# Patient Record
Sex: Male | Born: 1965
Health system: Southern US, Community
[De-identification: ages and names within clinical notes are randomized; demographics above are authoritative.]

## PROBLEM LIST (undated history)

## (undated) DIAGNOSIS — J189 Pneumonia, unspecified organism: Secondary | ICD-10-CM

## (undated) DIAGNOSIS — F32A Depression, unspecified: Secondary | ICD-10-CM

## (undated) DIAGNOSIS — F329 Major depressive disorder, single episode, unspecified: Secondary | ICD-10-CM

## (undated) DIAGNOSIS — C801 Malignant (primary) neoplasm, unspecified: Secondary | ICD-10-CM

## (undated) DIAGNOSIS — C61 Malignant neoplasm of prostate: Secondary | ICD-10-CM

## (undated) DIAGNOSIS — I10 Essential (primary) hypertension: Secondary | ICD-10-CM

## (undated) DIAGNOSIS — Z923 Personal history of irradiation: Secondary | ICD-10-CM

## (undated) DIAGNOSIS — M109 Gout, unspecified: Secondary | ICD-10-CM

## (undated) DIAGNOSIS — F419 Anxiety disorder, unspecified: Secondary | ICD-10-CM

## (undated) HISTORY — PX: OTHER SURGICAL HISTORY: SHX169

---

## 2004-10-03 ENCOUNTER — Inpatient Hospital Stay (HOSPITAL_COMMUNITY): Admission: RE | Admit: 2004-10-03 | Discharge: 2004-10-07 | Payer: Self-pay | Admitting: Psychiatry

## 2004-10-03 ENCOUNTER — Emergency Department (HOSPITAL_COMMUNITY): Admission: EM | Admit: 2004-10-03 | Discharge: 2004-10-03 | Payer: Self-pay | Admitting: Emergency Medicine

## 2004-10-03 ENCOUNTER — Ambulatory Visit: Payer: Self-pay | Admitting: Psychiatry

## 2004-10-10 ENCOUNTER — Other Ambulatory Visit (HOSPITAL_COMMUNITY): Admission: RE | Admit: 2004-10-10 | Discharge: 2004-11-04 | Payer: Self-pay | Admitting: Psychiatry

## 2005-03-28 ENCOUNTER — Other Ambulatory Visit (HOSPITAL_COMMUNITY): Admission: RE | Admit: 2005-03-28 | Discharge: 2005-04-13 | Payer: Self-pay | Admitting: Psychiatry

## 2005-03-29 ENCOUNTER — Ambulatory Visit: Payer: Self-pay | Admitting: Psychiatry

## 2008-03-04 ENCOUNTER — Emergency Department (HOSPITAL_COMMUNITY): Admission: EM | Admit: 2008-03-04 | Discharge: 2008-03-04 | Payer: Self-pay | Admitting: Emergency Medicine

## 2008-03-05 ENCOUNTER — Inpatient Hospital Stay (HOSPITAL_COMMUNITY): Admission: AD | Admit: 2008-03-05 | Discharge: 2008-03-08 | Payer: Self-pay | Admitting: Psychiatry

## 2008-03-05 ENCOUNTER — Ambulatory Visit: Payer: Self-pay | Admitting: Psychiatry

## 2008-03-05 ENCOUNTER — Emergency Department (HOSPITAL_COMMUNITY): Admission: EM | Admit: 2008-03-05 | Discharge: 2008-03-05 | Payer: Self-pay | Admitting: Emergency Medicine

## 2008-05-24 ENCOUNTER — Emergency Department (HOSPITAL_COMMUNITY): Admission: EM | Admit: 2008-05-24 | Discharge: 2008-05-24 | Payer: Self-pay | Admitting: Family Medicine

## 2008-10-23 ENCOUNTER — Emergency Department (HOSPITAL_COMMUNITY): Admission: EM | Admit: 2008-10-23 | Discharge: 2008-10-23 | Payer: Self-pay | Admitting: Emergency Medicine

## 2009-10-05 ENCOUNTER — Emergency Department (HOSPITAL_COMMUNITY): Admission: EM | Admit: 2009-10-05 | Discharge: 2009-10-05 | Payer: Self-pay | Admitting: Family Medicine

## 2009-10-11 ENCOUNTER — Emergency Department (HOSPITAL_COMMUNITY): Admission: EM | Admit: 2009-10-11 | Discharge: 2009-10-11 | Payer: Self-pay | Admitting: Family Medicine

## 2010-01-19 ENCOUNTER — Encounter
Admission: RE | Admit: 2010-01-19 | Discharge: 2010-02-10 | Payer: Self-pay | Source: Home / Self Care | Attending: Orthopedic Surgery | Admitting: Orthopedic Surgery

## 2010-02-13 ENCOUNTER — Encounter
Admission: RE | Admit: 2010-02-13 | Discharge: 2010-03-01 | Payer: Self-pay | Source: Home / Self Care | Attending: Orthopedic Surgery | Admitting: Orthopedic Surgery

## 2010-03-05 ENCOUNTER — Encounter: Payer: Self-pay | Admitting: Orthopedic Surgery

## 2010-04-29 LAB — POCT RAPID STREP A (OFFICE): Streptococcus, Group A Screen (Direct): NEGATIVE

## 2010-05-30 LAB — URINALYSIS, ROUTINE W REFLEX MICROSCOPIC
Glucose, UA: NEGATIVE mg/dL
Protein, ur: NEGATIVE mg/dL
Specific Gravity, Urine: 1.021 (ref 1.005–1.030)
Urobilinogen, UA: 1 mg/dL (ref 0.0–1.0)

## 2010-05-30 LAB — COMPREHENSIVE METABOLIC PANEL
AST: 84 U/L — ABNORMAL HIGH (ref 0–37)
Albumin: 4.7 g/dL (ref 3.5–5.2)
Alkaline Phosphatase: 78 U/L (ref 39–117)
Chloride: 101 mEq/L (ref 96–112)
GFR calc Af Amer: >60 mL/min (ref >60)
Potassium: 4.1 mEq/L (ref 3.5–5.1)
Total Bilirubin: 1.6 mg/dL — ABNORMAL HIGH (ref 0.3–1.2)

## 2010-05-30 LAB — CBC
HCT: 50.5 % (ref 39.0–52.0)
HCT: 51.1 % (ref 39.0–52.0)
Hemoglobin: 17.6 g/dL — ABNORMAL HIGH (ref 13.0–17.0)
Platelets: 225 10*3/uL (ref 150–400)
RBC: 5.08 MIL/uL (ref 4.22–5.81)
WBC: 7.1 10*3/uL (ref 4.0–10.5)

## 2010-05-30 LAB — DIFFERENTIAL
Eosinophils Relative: 2 % (ref 0–5)
Lymphocytes Relative: 31 % (ref 12–46)
Lymphs Abs: 2.5 10*3/uL (ref 0.7–4.0)
Monocytes Absolute: 0.5 10*3/uL (ref 0.1–1.0)
Monocytes Relative: 6 % (ref 3–12)
Neutro Abs: 4.8 10*3/uL (ref 1.7–7.7)

## 2010-05-30 LAB — POCT I-STAT, CHEM 8
BUN: <3 mg/dL — ABNORMAL LOW (ref 6–23)
Chloride: 102 mEq/L (ref 96–112)
Potassium: 4.7 mEq/L (ref 3.5–5.1)
Sodium: 138 mEq/L (ref 135–145)
TCO2: 26 mmol/L (ref 0–100)

## 2010-05-30 LAB — RAPID URINE DRUG SCREEN, HOSP PERFORMED
Amphetamines: NOT DETECTED
Benzodiazepines: NOT DETECTED
Benzodiazepines: POSITIVE — AB
Cocaine: NOT DETECTED
Tetrahydrocannabinol: NOT DETECTED

## 2010-05-30 LAB — TRICYCLICS SCREEN, URINE: TCA Scrn: NOT DETECTED

## 2010-05-30 LAB — BASIC METABOLIC PANEL
BUN: 1 mg/dL — ABNORMAL LOW (ref 6–23)
Chloride: 98 mEq/L (ref 96–112)
GFR calc Af Amer: >60 mL/min (ref >60)
GFR calc non Af Amer: >60 mL/min (ref >60)
Potassium: 4.1 mEq/L (ref 3.5–5.1)
Sodium: 136 mEq/L (ref 135–145)

## 2010-05-30 LAB — ETHANOL
Alcohol, Ethyl (B): 108 mg/dL — ABNORMAL HIGH (ref 0–10)
Alcohol, Ethyl (B): 299 mg/dL — ABNORMAL HIGH (ref 0–10)

## 2010-06-28 NOTE — H&P (Signed)
NAME:  Richard Holder, Richard Holder NO.:  [id number]   MEDICAL RECORD NO.:  [id number]          PATIENT TYPE:  IPS   LOCATION:  0500                          FACILITY:  [facility]   PHYSICIAN:  Landry Corporal, N.P.    DATE OF BIRTH:  13-Jun-1965   DATE OF ADMISSION:  03/05/2008  DATE OF DISCHARGE:                       PSYCHIATRIC ADMISSION ASSESSMENT   HISTORY OF PRESENT ILLNESS:  The patient is here for alcohol dependence,  wanting to get help, feeling scared about his use of alcohol, drinking  approximately 10-14 beers daily.  His drinking has increased over the  past several months.  Although feeling depressed, he denies any suicidal  thoughts.  Denies any other substance use.  Last drink was yesterday.   PAST PSYCHIATRIC HISTORY:  The patient is here in 2006 for problems with  alcohol.  Was at [facility] in the past.   SOCIAL HISTORY:  He is a married male, unemployed, has 2 years of  college.   FAMILY HISTORY:  Is unknown.   INDICATIONS:  The patient smokes cigarettes.  Again, drinking as above.  He denies any other substance, denies any seizure activity.   Primary care Lacey Wallman listed is Soledad Gerlach.   MEDICAL PROBLEMS:  Are hypertension, gout, and reflux.   MEDICATIONS:  Has been on atenolol, Prozac 20, allopurinol 100 mg daily.   DRUG ALLERGIES:  No known allergies.   PHYSICAL EXAM:  This is a middle-aged male resting in bed at this time,  fully assessed at [facility] emergency department.  Physical exam was  reviewed with no significant findings.  He did receive 2 mg of Ativan.  Temperature 97.6, 84 heart rate, 20 respirations, blood pressure is  123/84, 6 feet tall, 170 pounds.  Urine drug screen is positive for  benzodiazepines, BUN less than 3.  Alcohol level of 108.   MENTAL STATUS EXAM:  This is a sleepy but cooperative male, good eye  contact, appropriately dressed.  Speech is soft-spoken and mood is  depressed, feeling uncomfortable with some  withdrawal symptoms.  Thought  process are coherent and goal directed, function intact.  Memory is  intact.  Judgment appears to be good.  Insight also appears to be good.  Poor impulse control related to alcohol use.   AXIS I:  Alcohol dependence, depressive disorder NOS.  AXIS II:  Deferred.  AXIS III:  Hypertension, gout, and reflux.  AXIS IV:  Other psychosocial problems, possible problems with  occupation.  AXIS V:  Current is 40.   PLAN:  Contract for safety.  Stabilize mood and thinking.  Will detox  with the Librium protocol.  Work on relapse prevention.  Continue to  identify comorbidities.  Will continue with the patient's Prozac as  well.  The patient will be in the dual diagnosis group.  His case  manager will identify his followup.  Tentative length of stay is 3-4  days.      Landry Corporal, N.P.     JO/MEDQ  D:  03/06/2008  T:  03/06/2008  Job:  [id number]

## 2010-07-01 NOTE — Discharge Summary (Signed)
NAME:  DETROIT, FADELEY NO.:  [id number]   MEDICAL RECORD NO.:  [id number]          PATIENT TYPE:  IPS   LOCATION:  0500                          FACILITY:  [facility]   PHYSICIAN:  Geoffery Lyons, M.D.      DATE OF BIRTH:  September 11, 1965   DATE OF ADMISSION:  03/05/2008  DATE OF DISCHARGE:  03/08/2008                               DISCHARGE SUMMARY   CHIEF COMPLAINT AND PRESENT ILLNESS:  This was one of several admissions  to [facility], last one in 2006, for this 45 year old  male who endorsed he relapsed, was scared about his use of alcohol,  drinking 10 to 14 beers daily.  The drinking has increased over the past  several months, feeling depressed but denies any active suicidal  ideations.  No other substances.  The last drink was the day before.   PAST PSYCHIATRIC HISTORY:  Last admission in 2006 was at __________as  well as the [facility].   SUBSTANCE ABUSE HISTORY:  As already stated, dependence of alcohol.   MEDICAL HISTORY:  1. Active hypertension.  2. Gout.  3. Gastroesophageal reflux.   MEDICATIONS:  He has been on:  1. Atenolol.  2. Prozac 20 mg per day.  3. Allopurinol 100 mg per day.   PHYSICAL EXAMINATION:  Failed to show any acute findings.   LABORATORY WORKUP:  White blood cell 7.1, hemoglobin 17.4, mean  corpuscular component 100.6, sodium 140, potassium 4.1,  glucose 106,  SGOT 84, SGPT 154, total bilirubin 1.6, and TSH 5.883.   Mental status exam reveals alert and cooperative male in bed.  Endorsed  he was not feeling good, very soft spoken.  Mood depressed.  Affect  depressed.  Feeling uncomfortable with some of the withdrawal.  Thought  process is logical,  coherent, and relevant.  Endorsed a lot of shame  and guilt for the ongoing relapses.  No delusions.  No active suicidal  or homicidal ideas.  No Hallucinations.  Cognition well preserved.   ADMISSION DIAGNOSES:  AXIS I: Alcohol dependence.  Major  depressive  disorder.  AXIS II: No diagnosis.  AXIS III: Hypertension, gout, and gastroesophageal reflux.  AXIS IV: Moderate.  AXIS V: Global assessment of functioning upon admission 35, highest  global assessment of functioning in the last year 60.   COURSE IN HOSPITAL:  He was readmitted.  Started in individual and group  psychotherapy.  We  detoxified with Librium.  He was given some Ambien  for sleep and he was restarted on Prozac.  As already stated, 42-year-  old male, married x2, divorced x1.  Endorsed conflict in the  relationship with his wife, Dorna Leitz, when he came for treatment in 2006,  was nonsupportive of his going to meetings.  Says that eventually they  broke up.  Started drinking couple of beers, increased use to up to 24  per day, mostly everyday.  As his alcohol use increased, he became more  and more depressed.  He was at [facility] in 2006, __________  2005.  Major depressive disorders, trials with  Prozac, Paxil, Serzone,  Lexapro, Risperdal, Neurontin, Seroquel, trazodone, and Campral for  cravings.  He said he remarried.  Wife is supportive of his recovery,  but endorsed that she does not understand his depression.  He continues  to endorse the depressed mood.  Endorsed that he was depressed due to  realization of past failures.  There was a family session with his wife.  On January 23rd, he endorsed that he was feeling much better in terms of  the detox.  Endorsed he wanted to get sober for himself and to make  sobriety a priority.  Endorsed that his personality is different when  he drinks.  Continued to endorse he was ashamed of his relapse.  The  depression was worsening in terms of the debility and lack of interest  but endorsed that he felt that the Prozac helped before and he was  looking forward to continuing to use Prozac.  On January 24th,  he was  in full contact of reality.  There were no active suicidal or  homicidal  ideas.  No hallucinations or  delusions.  He was oriented and  motivated  to pursue outpatient treatment.  Endorsed that he had a strong family  support and that he having been through rehab before knew what he needed  to do and he was willing to do it.   DISCHARGE DIAGNOSES:  AXIS I: Major depressive disorder.  Alcohol  dependence.  AXIS II: No diagnosis.  AXIS III: Gout and high blood pressure.  AXIS IV: Moderate.  AXIS V: Global assessment of functioning upon discharge 60.   Discharged on:  1. Prozac 20 mg per day.  2. Allopurinol 100 mg per day.  3. Atenolol 50 mg per day.   Follow up [facility], Psychotherapy.  Medication to be prescribed by his  primary care Jacksyn Beeks.      Geoffery Lyons, M.D.  Electronically Signed     IL/MEDQ  D:  03/20/2008  T:  03/21/2008  Job:  [id number]

## 2012-06-04 DIAGNOSIS — C61 Malignant neoplasm of prostate: Secondary | ICD-10-CM

## 2012-06-04 HISTORY — DX: Malignant neoplasm of prostate: C61

## 2012-06-04 HISTORY — PX: PROSTATE BIOPSY: SHX241

## 2012-06-13 ENCOUNTER — Other Ambulatory Visit: Payer: Self-pay | Admitting: Urology

## 2012-07-16 ENCOUNTER — Encounter (HOSPITAL_COMMUNITY): Payer: Self-pay | Admitting: Pharmacy Technician

## 2012-07-26 NOTE — Patient Instructions (Addendum)
20 REZA CRYMES  07/26/2012   Your procedure is scheduled on: 08/07/12  Report to [facility] at 10:00 AM.  Call this number if you have problems the morning of surgery [phone]: [phone]   Remember:   Do not eat food or drink liquids After Midnight.      Do not wear jewelry, make-up or nail polish.  Do not wear lotions, powders, or perfumes. You may wear deodorant.  Do not shave 48 hours prior to surgery. Men may shave face and neck.  Do not bring valuables to the hospital.  Contacts, dentures or bridgework may not be worn into surgery.  Leave suitcase in the car. After surgery it may be brought to your room.  For patients admitted to the hospital, checkout time is 11:00 AM the day of discharge.   Please read over the following fact sheets that you were given: MRSA Information.  Birdie Sons, RN  pre op nurse call if needed [phone]    FAILURE TO FOLLOW THESE INSTRUCTIONS MAY RESULT IN CANCELLATION OF YOUR SURGERY   Patient Signature: ___________________________________________

## 2012-07-29 ENCOUNTER — Encounter (HOSPITAL_COMMUNITY)
Admission: RE | Admit: 2012-07-29 | Discharge: 2012-07-29 | Disposition: A | Discharge status: Home / Self Care | Payer: 59 | Source: Ambulatory Visit | Attending: Urology | Admitting: Urology

## 2012-07-29 ENCOUNTER — Ambulatory Visit (HOSPITAL_COMMUNITY)
Admission: RE | Admit: 2012-07-29 | Discharge: 2012-07-29 | Disposition: A | Discharge status: Home / Self Care | Payer: 59 | Source: Ambulatory Visit | Attending: Urology | Admitting: Urology

## 2012-07-29 ENCOUNTER — Encounter (HOSPITAL_COMMUNITY): Payer: Self-pay

## 2012-07-29 DIAGNOSIS — Z01818 Encounter for other preprocedural examination: Secondary | ICD-10-CM | POA: Insufficient documentation

## 2012-07-29 DIAGNOSIS — I10 Essential (primary) hypertension: Secondary | ICD-10-CM | POA: Insufficient documentation

## 2012-07-29 DIAGNOSIS — Z01810 Encounter for preprocedural cardiovascular examination: Secondary | ICD-10-CM | POA: Insufficient documentation

## 2012-07-29 DIAGNOSIS — Z01812 Encounter for preprocedural laboratory examination: Secondary | ICD-10-CM | POA: Insufficient documentation

## 2012-07-29 DIAGNOSIS — F17200 Nicotine dependence, unspecified, uncomplicated: Secondary | ICD-10-CM | POA: Insufficient documentation

## 2012-07-29 DIAGNOSIS — R918 Other nonspecific abnormal finding of lung field: Secondary | ICD-10-CM | POA: Insufficient documentation

## 2012-07-29 HISTORY — DX: Malignant (primary) neoplasm, unspecified: C80.1

## 2012-07-29 HISTORY — DX: Major depressive disorder, single episode, unspecified: F32.9

## 2012-07-29 HISTORY — DX: Anxiety disorder, unspecified: F41.9

## 2012-07-29 HISTORY — DX: Essential (primary) hypertension: I10

## 2012-07-29 HISTORY — DX: Depression, unspecified: F32.A

## 2012-07-29 HISTORY — DX: Pneumonia, unspecified organism: J18.9

## 2012-07-29 LAB — CBC
Hemoglobin: 16.8 g/dL (ref 13.0–17.0)
MCH: 31.1 pg (ref 26.0–34.0)
Platelets: 233 10*3/uL (ref 150–400)
RBC: 5.4 MIL/uL (ref 4.22–5.81)
WBC: 8.6 10*3/uL (ref 4.0–10.5)

## 2012-07-29 LAB — BASIC METABOLIC PANEL
BUN: 12 mg/dL (ref 6–23)
CO2: 31 mEq/L (ref 19–32)
Chloride: 100 mEq/L (ref 96–112)
Creatinine, Ser: 0.84 mg/dL (ref 0.50–1.35)

## 2012-07-29 NOTE — Progress Notes (Signed)
Abnormal Chest x-ray results routed to Dr. Margarita Grizzle via [facility]

## 2012-08-07 ENCOUNTER — Encounter (HOSPITAL_COMMUNITY): Payer: Self-pay | Admitting: *Deleted

## 2012-08-07 ENCOUNTER — Encounter (HOSPITAL_COMMUNITY): Payer: Self-pay | Admitting: Anesthesiology

## 2012-08-07 ENCOUNTER — Encounter (HOSPITAL_COMMUNITY)
Admission: RE | Disposition: A | Discharge status: Home / Self Care | Payer: Self-pay | Source: Ambulatory Visit | Attending: Urology

## 2012-08-07 ENCOUNTER — Ambulatory Visit (HOSPITAL_COMMUNITY)
Admission: RE | Admit: 2012-08-07 | Discharge: 2012-08-08 | Disposition: A | Discharge status: Home / Self Care | Payer: 59 | Source: Ambulatory Visit | Attending: Urology | Admitting: Urology

## 2012-08-07 ENCOUNTER — Ambulatory Visit (HOSPITAL_COMMUNITY): Payer: 59 | Admitting: Anesthesiology

## 2012-08-07 DIAGNOSIS — I10 Essential (primary) hypertension: Secondary | ICD-10-CM | POA: Insufficient documentation

## 2012-08-07 DIAGNOSIS — C61 Malignant neoplasm of prostate: Secondary | ICD-10-CM | POA: Insufficient documentation

## 2012-08-07 DIAGNOSIS — Z79899 Other long term (current) drug therapy: Secondary | ICD-10-CM | POA: Insufficient documentation

## 2012-08-07 DIAGNOSIS — N529 Male erectile dysfunction, unspecified: Secondary | ICD-10-CM | POA: Insufficient documentation

## 2012-08-07 HISTORY — PX: ROBOT ASSISTED LAPAROSCOPIC RADICAL PROSTATECTOMY: SHX5141

## 2012-08-07 LAB — CBC
HCT: 43.7 % (ref 39.0–52.0)
Hemoglobin: 15.2 g/dL (ref 13.0–17.0)
MCH: 31.5 pg (ref 26.0–34.0)
MCHC: 34.8 g/dL (ref 30.0–36.0)
MCV: 90.7 fL (ref 78.0–100.0)
Platelets: 231 K/uL (ref 150–400)
RBC: 4.82 MIL/uL (ref 4.22–5.81)
RDW: 12.6 % (ref 11.5–15.5)
WBC: 19.4 K/uL — ABNORMAL HIGH (ref 4.0–10.5)

## 2012-08-07 LAB — CREATININE, SERUM
Creatinine, Ser: 0.88 mg/dL (ref 0.50–1.35)
GFR calc Af Amer: >90 mL/min (ref >90)
GFR calc non Af Amer: >90 mL/min (ref >90)

## 2012-08-07 LAB — HEMOGLOBIN AND HEMATOCRIT, BLOOD: HCT: 44 % (ref 39.0–52.0)

## 2012-08-07 SURGERY — ROBOTIC ASSISTED LAPAROSCOPIC RADICAL PROSTATECTOMY LEVEL 1
Anesthesia: General | Site: Abdomen | Wound class: Clean Contaminated

## 2012-08-07 MED ORDER — FLUOXETINE HCL 20 MG PO CAPS
40.0000 mg | ORAL_CAPSULE | Freq: Every day | ORAL | Status: DC
  Administered 2012-08-07: 40 mg via ORAL
  Filled 2012-08-07 (×2): qty 2

## 2012-08-07 MED ORDER — LIDOCAINE HCL (CARDIAC) 20 MG/ML IV SOLN
INTRAVENOUS | Status: DC | PRN
  Administered 2012-08-07: 80 mg via INTRAVENOUS

## 2012-08-07 MED ORDER — SODIUM CHLORIDE 0.9 % IR SOLN
Status: DC | PRN
  Administered 2012-08-07: 1000 mL

## 2012-08-07 MED ORDER — HEPARIN SODIUM (PORCINE) 5000 UNIT/ML IJ SOLN
5000.0000 [IU] | Freq: Once | INTRAMUSCULAR | Status: AC
  Administered 2012-08-07: 5000 [IU] via SUBCUTANEOUS
  Filled 2012-08-07: qty 1

## 2012-08-07 MED ORDER — PROPOFOL 10 MG/ML IV BOLUS
INTRAVENOUS | Status: DC | PRN
  Administered 2012-08-07: 200 mg via INTRAVENOUS

## 2012-08-07 MED ORDER — FENTANYL CITRATE 0.05 MG/ML IJ SOLN
INTRAMUSCULAR | Status: DC | PRN
  Administered 2012-08-07: 50 ug via INTRAVENOUS
  Administered 2012-08-07: 100 ug via INTRAVENOUS
  Administered 2012-08-07 (×4): 50 ug via INTRAVENOUS

## 2012-08-07 MED ORDER — SODIUM CHLORIDE 0.9 % IJ SOLN
INTRAMUSCULAR | Status: DC | PRN
  Administered 2012-08-07: 17:00:00

## 2012-08-07 MED ORDER — HEPARIN SODIUM (PORCINE) 5000 UNIT/ML IJ SOLN
5000.0000 [IU] | Freq: Three times a day (TID) | INTRAMUSCULAR | Status: DC
  Administered 2012-08-08: 5000 [IU] via SUBCUTANEOUS
  Filled 2012-08-07 (×4): qty 1

## 2012-08-07 MED ORDER — HYDROMORPHONE HCL PF 1 MG/ML IJ SOLN
0.2500 mg | INTRAMUSCULAR | Status: DC | PRN
  Administered 2012-08-07 (×2): 0.5 mg via INTRAVENOUS

## 2012-08-07 MED ORDER — BUPIVACAINE LIPOSOME 1.3 % IJ SUSP
20.0000 mL | Freq: Once | INTRAMUSCULAR | Status: DC
  Filled 2012-08-07: qty 20

## 2012-08-07 MED ORDER — ACETAMINOPHEN 325 MG PO TABS: 650.0000 mg | ORAL_TABLET | ORAL | Status: DC | PRN

## 2012-08-07 MED ORDER — STERILE WATER FOR IRRIGATION IR SOLN
Status: DC | PRN
  Administered 2012-08-07: 3000 mL

## 2012-08-07 MED ORDER — HYDROMORPHONE HCL PF 1 MG/ML IJ SOLN
INTRAMUSCULAR | Status: AC
  Filled 2012-08-07: qty 1

## 2012-08-07 MED ORDER — HYDROMORPHONE HCL PF 1 MG/ML IJ SOLN
INTRAMUSCULAR | Status: DC | PRN
  Administered 2012-08-07: 0.5 mg via INTRAVENOUS
  Administered 2012-08-07: 1 mg via INTRAVENOUS

## 2012-08-07 MED ORDER — INDIGOTINDISULFONATE SODIUM 8 MG/ML IJ SOLN
INTRAMUSCULAR | Status: AC
  Filled 2012-08-07: qty 10

## 2012-08-07 MED ORDER — LACTATED RINGERS IV SOLN
INTRAVENOUS | Status: DC | PRN
  Administered 2012-08-07 (×2): via INTRAVENOUS

## 2012-08-07 MED ORDER — CLONAZEPAM 0.5 MG PO TABS
0.5000 mg | ORAL_TABLET | Freq: Every day | ORAL | Status: DC
  Administered 2012-08-07: 0.5 mg via ORAL
  Filled 2012-08-07: qty 1

## 2012-08-07 MED ORDER — SODIUM CHLORIDE 0.9 % IV SOLN
INTRAVENOUS | Status: DC
  Administered 2012-08-07 – 2012-08-08 (×2): via INTRAVENOUS

## 2012-08-07 MED ORDER — LACTATED RINGERS IV SOLN: INTRAVENOUS | Status: DC

## 2012-08-07 MED ORDER — GLYCOPYRROLATE 0.2 MG/ML IJ SOLN
INTRAMUSCULAR | Status: DC | PRN
  Administered 2012-08-07: .6 mg via INTRAVENOUS

## 2012-08-07 MED ORDER — MORPHINE SULFATE 2 MG/ML IJ SOLN
2.0000 mg | INTRAMUSCULAR | Status: DC | PRN
  Administered 2012-08-07 – 2012-08-08 (×2): 2 mg via INTRAVENOUS
  Filled 2012-08-07 (×2): qty 1

## 2012-08-07 MED ORDER — BISACODYL 10 MG RE SUPP
10.0000 mg | Freq: Two times a day (BID) | RECTAL | Status: DC
  Administered 2012-08-07 – 2012-08-08 (×2): 10 mg via RECTAL
  Filled 2012-08-07 (×3): qty 1

## 2012-08-07 MED ORDER — SODIUM CHLORIDE 0.9 % IV BOLUS (SEPSIS)
1000.0000 mL | Freq: Once | INTRAVENOUS | Status: AC
  Administered 2012-08-07: 1000 mL via INTRAVENOUS

## 2012-08-07 MED ORDER — SENNOSIDES-DOCUSATE SODIUM 8.6-50 MG PO TABS
1.0000 | ORAL_TABLET | Freq: Two times a day (BID) | ORAL | Status: DC
  Administered 2012-08-07 – 2012-08-08 (×2): 1 via ORAL
  Filled 2012-08-07 (×3): qty 1

## 2012-08-07 MED ORDER — ACETAMINOPHEN 10 MG/ML IV SOLN
1000.0000 mg | Freq: Four times a day (QID) | INTRAVENOUS | Status: DC
  Administered 2012-08-07 – 2012-08-08 (×3): 1000 mg via INTRAVENOUS
  Filled 2012-08-07 (×4): qty 100

## 2012-08-07 MED ORDER — OXYCODONE HCL 5 MG PO TABS
5.0000 mg | ORAL_TABLET | ORAL | Status: DC | PRN
  Administered 2012-08-08: 5 mg via ORAL
  Filled 2012-08-07: qty 1

## 2012-08-07 MED ORDER — ATENOLOL 50 MG PO TABS
50.0000 mg | ORAL_TABLET | Freq: Every day | ORAL | Status: DC
  Administered 2012-08-07: 50 mg via ORAL
  Filled 2012-08-07 (×2): qty 1

## 2012-08-07 MED ORDER — ONDANSETRON HCL 4 MG/2ML IJ SOLN
4.0000 mg | INTRAMUSCULAR | Status: DC | PRN
  Administered 2012-08-07: 4 mg via INTRAVENOUS
  Filled 2012-08-07: qty 2

## 2012-08-07 MED ORDER — CEFAZOLIN SODIUM-DEXTROSE 2-3 GM-% IV SOLR
INTRAVENOUS | Status: AC
  Filled 2012-08-07: qty 50

## 2012-08-07 MED ORDER — BACITRACIN-NEOMYCIN-POLYMYXIN 400-5-5000 EX OINT: 1.0000 "application " | TOPICAL_OINTMENT | Freq: Three times a day (TID) | CUTANEOUS | Status: DC | PRN

## 2012-08-07 MED ORDER — LACTATED RINGERS IV SOLN
INTRAVENOUS | Status: DC | PRN
  Administered 2012-08-07: 14:00:00

## 2012-08-07 MED ORDER — HEPARIN SODIUM (PORCINE) 1000 UNIT/ML IJ SOLN
INTRAMUSCULAR | Status: AC
  Filled 2012-08-07: qty 1

## 2012-08-07 MED ORDER — INDIGOTINDISULFONATE SODIUM 8 MG/ML IJ SOLN
INTRAMUSCULAR | Status: DC | PRN
  Administered 2012-08-07 (×2): 5 mL via INTRAVENOUS

## 2012-08-07 MED ORDER — ONDANSETRON HCL 4 MG/2ML IJ SOLN
INTRAMUSCULAR | Status: DC | PRN
  Administered 2012-08-07: 4 mg via INTRAVENOUS

## 2012-08-07 MED ORDER — BUPIVACAINE-EPINEPHRINE PF 0.25-1:200000 % IJ SOLN
INTRAMUSCULAR | Status: AC
  Filled 2012-08-07: qty 30

## 2012-08-07 MED ORDER — HYOSCYAMINE SULFATE 0.125 MG SL SUBL
0.1250 mg | SUBLINGUAL_TABLET | SUBLINGUAL | Status: DC | PRN
  Filled 2012-08-07: qty 1

## 2012-08-07 MED ORDER — CEFAZOLIN SODIUM 1-5 GM-% IV SOLN
1.0000 g | Freq: Three times a day (TID) | INTRAVENOUS | Status: AC
  Administered 2012-08-07 – 2012-08-08 (×2): 1 g via INTRAVENOUS
  Filled 2012-08-07 (×2): qty 50

## 2012-08-07 MED ORDER — LACTATED RINGERS IV SOLN
INTRAVENOUS | Status: DC
  Administered 2012-08-07: 1000 mL via INTRAVENOUS

## 2012-08-07 MED ORDER — NEOSTIGMINE METHYLSULFATE 1 MG/ML IJ SOLN
INTRAMUSCULAR | Status: DC | PRN
  Administered 2012-08-07: 5 mg via INTRAVENOUS

## 2012-08-07 MED ORDER — MIDAZOLAM HCL 5 MG/5ML IJ SOLN
INTRAMUSCULAR | Status: DC | PRN
  Administered 2012-08-07: 2 mg via INTRAVENOUS

## 2012-08-07 MED ORDER — CEFAZOLIN SODIUM-DEXTROSE 2-3 GM-% IV SOLR
2.0000 g | INTRAVENOUS | Status: AC
  Administered 2012-08-07: 2 g via INTRAVENOUS

## 2012-08-07 MED ORDER — ROCURONIUM BROMIDE 100 MG/10ML IV SOLN
INTRAVENOUS | Status: DC | PRN
  Administered 2012-08-07: 50 mg via INTRAVENOUS
  Administered 2012-08-07 (×3): 10 mg via INTRAVENOUS
  Administered 2012-08-07 (×2): 20 mg via INTRAVENOUS

## 2012-08-07 SURGICAL SUPPLY — 57 items
APPLIER CLIP 5 13 M/L LIGAMAX5 (MISCELLANEOUS) ×2
CANISTER SUCTION 2500CC (MISCELLANEOUS) ×2 IMPLANT
CATH FOLEY 2WAY SLVR 18FR 30CC (CATHETERS) ×2 IMPLANT
CATH ROBINSON RED A/P 16FR (CATHETERS) ×2 IMPLANT
CATH ROBINSON RED A/P 8FR (CATHETERS) ×2 IMPLANT
CATH TIEMANN FOLEY 18FR 5CC (CATHETERS) ×2 IMPLANT
CHLORAPREP W/TINT 26ML (MISCELLANEOUS) ×2 IMPLANT
CLIP APPLIE 5 13 M/L LIGAMAX5 (MISCELLANEOUS) ×1 IMPLANT
CLIP LIGATING HEM O LOK PURPLE (MISCELLANEOUS) ×8 IMPLANT
CLOTH BEACON ORANGE TIMEOUT ST (SAFETY) ×2 IMPLANT
CORD HIGH FREQUENCY UNIPOLAR (ELECTROSURGICAL) ×2 IMPLANT
CORDS BIPOLAR (ELECTRODE) ×2 IMPLANT
COVER SURGICAL LIGHT HANDLE (MISCELLANEOUS) ×2 IMPLANT
COVER TIP SHEARS 8 DVNC (MISCELLANEOUS) ×1 IMPLANT
COVER TIP SHEARS 8MM DA VINCI (MISCELLANEOUS) ×1
CUTTER ECHEON FLEX ENDO 45 340 (ENDOMECHANICALS) ×2 IMPLANT
DECANTER SPIKE VIAL GLASS SM (MISCELLANEOUS) IMPLANT
DRAPE SURG IRRIG POUCH 19X23 (DRAPES) ×2 IMPLANT
DRAPE UTILITY XL STRL (DRAPES) ×2 IMPLANT
DRSG TEGADERM 2-3/8X2-3/4 SM (GAUZE/BANDAGES/DRESSINGS) ×8 IMPLANT
DRSG TEGADERM 4X4.75 (GAUZE/BANDAGES/DRESSINGS) ×4 IMPLANT
DRSG TEGADERM 6X8 (GAUZE/BANDAGES/DRESSINGS) ×4 IMPLANT
ELECT REM PT RETURN 9FT ADLT (ELECTROSURGICAL) ×2
ELECTRODE REM PT RTRN 9FT ADLT (ELECTROSURGICAL) ×1 IMPLANT
GAUZE SPONGE 2X2 8PLY STRL LF (GAUZE/BANDAGES/DRESSINGS) ×1 IMPLANT
GLOVE BIOGEL M 7.0 STRL (GLOVE) IMPLANT
GLOVE BIOGEL PI IND STRL 7.5 (GLOVE) ×3 IMPLANT
GLOVE BIOGEL PI INDICATOR 7.5 (GLOVE) ×3
GLOVE ECLIPSE 7.0 STRL STRAW (GLOVE) ×4 IMPLANT
GOWN PREVENTION PLUS XLARGE (GOWN DISPOSABLE) ×2 IMPLANT
GOWN STRL NON-REIN LRG LVL3 (GOWN DISPOSABLE) ×6 IMPLANT
GOWN STRL REIN XL XLG (GOWN DISPOSABLE) ×2 IMPLANT
HOLDER FOLEY CATH W/STRAP (MISCELLANEOUS) ×2 IMPLANT
IV LACTATED RINGERS 1000ML (IV SOLUTION) IMPLANT
KIT ACCESSORY DA VINCI DISP (KITS) ×1
KIT ACCESSORY DVNC DISP (KITS) ×1 IMPLANT
MARKER SKIN DUAL TIP RULER LAB (MISCELLANEOUS) ×2 IMPLANT
NDL SAFETY ECLIPSE 18X1.5 (NEEDLE) ×1 IMPLANT
NEEDLE HYPO 18GX1.5 SHARP (NEEDLE) ×1
PACK ROBOT UROLOGY CUSTOM (CUSTOM PROCEDURE TRAY) ×2 IMPLANT
POSITIONER SURGICAL ARM (MISCELLANEOUS) IMPLANT
POUCH SPECIMEN RETRIEVAL 10MM (ENDOMECHANICALS) ×2 IMPLANT
RELOAD GREEN ECHELON 45 (STAPLE) ×2 IMPLANT
SCISSORS LAP 5X35 DISP (ENDOMECHANICALS) ×2 IMPLANT
SCRUB PCMX 4 OZ (MISCELLANEOUS) ×2 IMPLANT
SET TUBE IRRIG SUCTION NO TIP (IRRIGATION / IRRIGATOR) ×2 IMPLANT
SOLUTION ELECTROLUBE (MISCELLANEOUS) ×2 IMPLANT
SPONGE GAUZE 2X2 STER 10/PKG (GAUZE/BANDAGES/DRESSINGS) ×1
STAPLER VISISTAT 35W (STAPLE) ×2 IMPLANT
SUT V-LOC BARB 180 2/0GR6 GS22 (SUTURE) ×2
SUT VIC AB 3-0 SH 27 (SUTURE) ×1
SUT VIC AB 3-0 SH 27X BRD (SUTURE) ×1 IMPLANT
SUT VICRYL 0 UR6 27IN ABS (SUTURE) ×8 IMPLANT
SUTURE V-LC BRB 180 2/0GR6GS22 (SUTURE) ×1 IMPLANT
SYR 27GX1/2 1ML LL SAFETY (SYRINGE) ×2 IMPLANT
TOWEL OR NON WOVEN STRL DISP B (DISPOSABLE) ×2 IMPLANT
WATER STERILE IRR 1500ML POUR (IV SOLUTION) IMPLANT

## 2012-08-07 NOTE — Anesthesia Preprocedure Evaluation (Addendum)
Anesthesia Evaluation  Patient identified by MRN, date of birth, ID band Patient awake    Reviewed: Allergy & Precautions, H&P , NPO status , Patient's Chart, lab work & pertinent test results, reviewed documented beta blocker date and time   Airway Mallampati: II TM Distance: >3 FB Neck ROM: full    Dental no notable dental hx. (+) Teeth Intact and Dental Advisory Given   Pulmonary Current Smoker,  breath sounds clear to auscultation  Pulmonary exam normal       Cardiovascular Exercise Tolerance: Good hypertension, Pt. on home beta blockers Rhythm:regular Rate:Normal     Neuro/Psych negative neurological ROS  negative psych ROS   GI/Hepatic negative GI ROS, Neg liver ROS,   Endo/Other  negative endocrine ROS  Renal/GU negative Renal ROS  negative genitourinary   Musculoskeletal   Abdominal   Peds  Hematology negative hematology ROS (+)   Anesthesia Other Findings   Reproductive/Obstetrics negative OB ROS                          Anesthesia Physical Anesthesia Plan  ASA: II  Anesthesia Plan: General   Post-op Pain Management:    Induction: Intravenous  Airway Management Planned: Oral ETT  Additional Equipment:   Intra-op Plan:   Post-operative Plan: Extubation in OR  Informed Consent: I have reviewed the patients History and Physical, chart, labs and discussed the procedure including the risks, benefits and alternatives for the proposed anesthesia with the patient or authorized representative who has indicated his/her understanding and acceptance.   Dental Advisory Given  Plan Discussed with: CRNA and Surgeon  Anesthesia Plan Comments:         Anesthesia Quick Evaluation

## 2012-08-07 NOTE — Preoperative (Signed)
Beta Blockers   Reason not to administer Beta Blockers:Not Applicable 

## 2012-08-07 NOTE — Transfer of Care (Signed)
Immediate Anesthesia Transfer of Care Note  Patient: Richard Holder  Procedure(s) Performed: Procedure(s): ROBOTIC ASSISTED LAPAROSCOPIC RADICAL PROSTATECTOMY LEVEL 1 (N/A)  Patient Location: PACU  Anesthesia Type:General  Level of Consciousness: awake, alert , oriented, sedated and patient cooperative  Airway & Oxygen Therapy: Patient Spontanous Breathing and Patient connected to face mask oxygen  Post-op Assessment: Report given to PACU RN and Post -op Vital signs reviewed and stable  Post vital signs: Reviewed and stable  Complications: No apparent anesthesia complications

## 2012-08-07 NOTE — H&P (Signed)
Urology History and Physical Exam  CC: Prostate cancer  HPI:  47 year old male presents today for prostate cancer. This was discovered based on a prostate biopsy for an elevated PSA of 5.8 in February 2014. Prostate biopsy revealed a Gleason score 3+3 equal 6 involving 3 cores. The cores involved were all on the left involving the left lateral and medial base as well as the left medial midportion of the prostate. His prostate volume was 26 cc. He is noted to have erectile dysfunction prior to his prostate biopsy. He presents today for robot-assisted radical prostatectomy with bilateral nerve sparing to be determined at the time of surgery. We have discussed the risks, benefits, alternatives, and likely that she tinkles. This has not been associated with gross hematuria. Resting makes it better or worse. UA 07/23/12 was negative for signs of infection.    Preoperative chest x-ray recommended to repeat a PA film with nipple markers to rule out pulmonary nodule. Given his extremely low chance of metastatic disease to the lung I have recommended proceeding with surgery as scheduled and obtaining a chest x-ray in the future.  PMH: Past Medical History  Diagnosis Date  . Hypertension   . Anxiety   . Depression   . Pneumonia     hx of  . Cancer     prostate     PSH: Past Surgical History  Procedure Laterality Date  . Skin cancer removed Left     shoulder    Allergies: No Known Allergies  Medications: No prescriptions prior to admission     Social History: History   Social History  . Marital Status: Married    Spouse Name: N/A    Number of Children: N/A  . Years of Education: N/A   Occupational History  . Not on file.   Social History Main Topics  . Smoking status: Current Every Day Smoker -- 1.00 packs/day for 30 years    Types: Cigarettes  . Smokeless tobacco: Never Used  . Alcohol Use: No     Comment: 4.5 years sober  . Drug Use: No  . Sexually Active: Not on file    Other Topics Concern  . Not on file   Social History Narrative  . No narrative on file    Family History: No family history on file.  Review of Systems: Positive: None. Negative: Fever, chest pain, or SOB.  A further 10 point review of systems was negative except what is listed in the HPI.  Physical Exam: Filed Vitals:   08/07/12 1009  BP: 117/83  Pulse: 80  Temp: 99 F (37.2 C)  Resp: 18    General: No acute distress.  Awake. Head:  Normocephalic.  Atraumatic. ENT:  EOMI.  Mucous membranes moist Neck:  Supple.  No lymphadenopathy. CV:  S1 present. S2 present. Regular rate. Pulmonary: Equal effort bilaterally.  Clear to auscultation bilaterally. Abdomen: Soft.  Non- tender to palpation. Skin:  Normal turgor.  No visible rash. Extremity: No gross deformity of bilateral upper extremities.  No gross deformity of    bilateral lower extremities. Neurologic: Alert. Appropriate mood.    Studies:  No results found for this basename: HGB, WBC, PLT,  in the last 72 hours  No results found for this basename: NA, K, CL, CO2, BUN, CREATININE, CALCIUM, MAGNESIUM, GFRNONAA, GFRAA,  in the last 72 hours   No results found for this basename: PT, INR, APTT,  in the last 72 hours   No components found with this basename:  ABG,     Assessment:  Prostate cancer  Plan: To OR  for robot-assisted radical prostatectomy with bilateral nerve sparing to be determined at the time of surgery.

## 2012-08-07 NOTE — Progress Notes (Signed)
Post op check.  Patient doing well in PACU. Pain controlled w/ IV pain medications.  Filed Vitals:   08/07/12 1800  BP: 156/95  Pulse: 88  Temp: 98.3 F (36.8 C)  Resp: 15   Gen: NAD. Somewhat sedated. CV: Regular pulses Abd: appropriately tender to palpation. Incisions dressed. JP w/ serosanguinous fluid. GU: Foley catheter draining blue urine (from indigo given during surgery)  Hgb 14.9  A/P: Prostate cancer Robotic prostatectomy completed today without complications.  -Ambulate tonight. -Observe overnight. -IV fluids.

## 2012-08-07 NOTE — Op Note (Signed)
DATE OF PROCEDURE: 08/07/12   OPERATIVE REPORT  SURGEON: Natalia Leatherwood, MD  ASSISTANT:  Pecola Leisure, PA   PREOPERATIVE DIAGNOSIS: Prostate cancer.  POSTOPERATIVE DIAGNOSIS: Prostate cancer.   PROCEDURE PERFORMED:  Robotic-assisted laparoscopic radical prostatectomy Bilateral nerve sparing.   ESTIMATED BLOOD LOSS: 100 cc.  DRAINS: Jackson-Pratt drain, left lower quadrant and Foley catheter.   SPECIMEN: Prostate, left seminal vesicle, portion of right seminal vesicle   FINDINGS: Right seminal vesicle difficult to dissect. Good plane between nerves and prostate.   HISTORY OF PRESENT ILLNESS: 47 year old male was found to have an elevated PSA. Biopsy revealed 3 cores of 3+3=6 prostate cancer. After evaluation and discussion of management options, he elected for surgical extirpation of his prostate.   PROCEDURE IN DETAIL: Informed consent was obtained. He received subcutaneous heparin for DVT prophylaxis before being taken to the OR. The patient was taken to the operating room, where he was placed in the supine position. IV antibiotics were infused and general anesthesia was induced. A time- out was performed, in which the correct patient, surgical site, and procedure were identified and agreed upon by the team. Hair was removed  from his abdomen and genitals and then he was placed in a dorsal  lithotomy position. All pertinent neurovascular pressure points were padded appropriately. His arms were tucked to the side using gel padding. After this, his abdomen and genitals were prepped and draped in the usual sterile fashion. A Foley catheter was placed on the field, 10 cc of sterile water was placed into the balloon.   He was then placed in a Trendelenburg position. Access was gained for laparoscopic procedure by using the Hasson technique by cutting down to the fascia in the midline above the umbilicus. Once the peritoneum was accessed, a 10 mm  port was placed and abdomen was  insufflated with carbon dioxide. Opening pressures were initially low so insufflation was continued. Next, markings were made for placement of the 1st & 3rd robotic arm ports in the usual areas with the ports being placed approximately 10 cm from the midline on either  side of camera (2nd) arm.  A 10 mm assistant port was placed on the far right lateral side of the abdomen. A 5 mm assistant port was placed between the right robotic (1st arm) and the camera port (2nd arm). The 4th robotic arm port was placed at the far left lateral side of the abdomen. All these were placed under direct visualization, and the robot was docked to the robotic ports.  After this was done, the urachal remnant was identified and divided and the bladder was  Dissected from the anterior wall of the abdomen. This was taken down to  the endopelvic fascia bilaterally and the perineum was split down to the  vas deferens bilaterally. The vas deferens was divided bipolar and monopolar electrocautery. After this was done, the prograsp was used to  retract the bladder cephalad. After this was done, the endopelvic fascia was incised  bilaterally and carried anterior and posteriorly bilaterally. The  puboprostatic ligaments were then divided and then the stapler device  was used to divide and ligate the dorsal venous complex.   After this was complete, attention was turned back to the junction between the prostate and bladder neck.  Dissection was carried down between the prostate and the bladder until  the Foley catheter was encountered. It was deflated and then placed  through the dissection site and then anterior traction was placed by  grasping  the catheter with the 4th robot arm. The remainder of the bladder neck  was then dissected out, and dissection was carried down posteriorly making sure to avoid reentry into the bladder until the seminal vesicles were encountered.   The seminal vesicles and vas deferens were then  dissected out bilaterally. The tissue around the seminal vesicles and vas deferens was adherent making dissection difficult. I was able to dissect out the left seminal vesicle in its entirety. The right seminal vesicle was not able to dissect out in its entirety. After these were done, they were placed on traction anteriorly and blunt dissection was carried out between the rectum and the prostate. During this process both seminal vesicles for and these were sent out to go along with the prostate for permanent pathology.  Attention was turned to the right side. It was evaluated and found to have a good plane of dissection, therefore a  nerve sparing technique was undertaken by incising medial to the neurovascular bundle and releasing it laterally. After this was carried  out, the right prostatic pedicle was controlled with sharp dissection  and Hem-o-lok clips. Dissection was then carried around posterior to  the prostate to the previous dissected area between the rectum and prostate.  Attention was turned to the left side. It was evaluated and found to have a good plane of dissection, therefore a  nerve sparing technique was undertaken by incising medial to the neurovascular bundle and releasing it laterally. After this was carried  out, the left prostatic pedicle was controlled with sharp dissection  and Hem-o-lok clips. Dissection was then carried around posterior to  the prostate to the previous dissected area between the rectum and prostate.  Dissection was then carried up to the urethra which was all that remained attaching the prostate to the body. The urethra was  then divided with scissors and the prostate was placed to the side.   The pelvis was irrigated and flushing of air into the rectal catheter showed no air bubbles.   I was able to visualize efflux from both ureter orifices. After this, anastomosis of the bladder neck to the urethra was carried out. A 3-0 V-lock suture was used to  approximate the posterior bladder to the posterior urethra.   A Double-armed Monocryl suture was used in a running fashion to perform the anastomosis. I was careful to ensure mucosal to mucosal approximation.  After this was done, a Foley catheter was placed through the anastomosis and into the bladder. It was irrigated and found to be water tight. It was tied down and the suture needles were removed from the body.   A Jackson-Pratt drain was placed through the port of the 4th robot arm. The specimen was placed in the EndoCatch bag. The robot was  undocked and the operating table was leveled. The EndoCatch bag was removed from the body by enlarging  the midline of the umbilicus. All ports were checked and found to be  free of bleeding. The 10 mm assistant port was closed with a suture  passer under direct visualization. The fascia of the umbilical port was close with interrupted vicryl suture in a figure-of-eight fashion until there was no defect palpable. Care was taken to avoid incorporation of intra-abdominal contents into the closure. After this was done, the drain was sutured into place, and the local injection with 40 mL of Exparel was performed. All wounds were irrigated with  sterile normal saline and then closed with Staples and then dressed with  sterile dressing.  Tegaderm and drain gauze was placed over the Jackson-Pratt drain and  Foley catheter was placed to closed drainage. The patient was placed  back in supine position. Anesthesia was reversed. He was taken to PACU  in stable condition. He will be kept overnight for evaluation.    All counts were correct at the end of the case.

## 2012-08-08 ENCOUNTER — Encounter (HOSPITAL_COMMUNITY): Payer: Self-pay | Admitting: Urology

## 2012-08-08 LAB — TYPE AND SCREEN
ABO/RH(D): AB POS
Antibody Screen: NEGATIVE
Unit division: 0

## 2012-08-08 LAB — BASIC METABOLIC PANEL
BUN: 7 mg/dL (ref 6–23)
CO2: 26 mEq/L (ref 19–32)
Calcium: 8.3 mg/dL — ABNORMAL LOW (ref 8.4–10.5)
Chloride: 106 mEq/L (ref 96–112)
Creatinine, Ser: 0.8 mg/dL (ref 0.50–1.35)
GFR calc Af Amer: >90 mL/min (ref >90)

## 2012-08-08 MED ORDER — HYOSCYAMINE SULFATE 0.125 MG PO TABS: 0.1250 mg | ORAL_TABLET | ORAL | Status: DC | PRN

## 2012-08-08 MED ORDER — OXYBUTYNIN CHLORIDE 5 MG PO TABS: 5.0000 mg | ORAL_TABLET | Freq: Four times a day (QID) | ORAL | Status: DC | PRN

## 2012-08-08 MED ORDER — BISACODYL 10 MG RE SUPP: 10.0000 mg | Freq: Two times a day (BID) | RECTAL | Status: DC

## 2012-08-08 MED ORDER — CIPROFLOXACIN HCL 500 MG PO TABS: 500.0000 mg | ORAL_TABLET | Freq: Two times a day (BID) | ORAL | Status: DC

## 2012-08-08 MED ORDER — OXYCODONE-ACETAMINOPHEN 5-325 MG PO TABS: 1.0000 | ORAL_TABLET | ORAL | Status: DC | PRN

## 2012-08-08 MED ORDER — BACITRACIN-NEOMYCIN-POLYMYXIN 400-5-5000 EX OINT: TOPICAL_OINTMENT | Freq: Three times a day (TID) | CUTANEOUS | Status: DC

## 2012-08-08 MED ORDER — SENNOSIDES-DOCUSATE SODIUM 8.6-50 MG PO TABS: 1.0000 | ORAL_TABLET | Freq: Two times a day (BID) | ORAL | Status: DC

## 2012-08-08 NOTE — Progress Notes (Signed)
Urology Progress Note  Subjective:     No acute urologic events overnight. Positive early ambulation. Tolerating fluids. Negative flatus or BM. Pain controlled with IV medication.  We reviewed the surgical findings and course of the surgery.  ROS: Negative: Nausea, emesis.  Objective:  Patient Vitals for the past 24 hrs:  BP Temp Temp src Pulse Resp SpO2 Height Weight  08/08/12 0538 124/84 mmHg 98.4 F (36.9 C) Oral 77 16 94 % - -  08/08/12 0236 104/60 mmHg 98.6 F (37 C) Oral 86 16 93 % - -  08/07/12 2239 119/75 mmHg 98.5 F (36.9 C) Oral 103 14 92 % - -  08/07/12 1838 153/88 mmHg 98 F (36.7 C) Oral 87 12 98 % 5\' 11"  (1.803 m) 83.915 kg (185 lb)  08/07/12 1815 154/95 mmHg - - 93 11 97 % - -  08/07/12 1800 156/95 mmHg 98.3 F (36.8 C) - 88 15 98 % - -  08/07/12 1745 164/98 mmHg - - 91 13 97 % - -  08/07/12 1730 164/96 mmHg - - 91 13 100 % - -  08/07/12 1726 169/96 mmHg 98.4 F (36.9 C) - 93 12 100 % - -  08/07/12 1009 117/83 mmHg 99 F (37.2 C) Oral 80 18 96 % - -    Physical Exam: General:  No acute distress, awake Cardiovascular:    [x]   S1/S2 present, RRR  []   Irregularly irregular Chest:  CTA-B Abdomen:               []  Soft, appropriately TTP  []  Soft, NTTP  [x]  Soft, appropriately TTP, incision(s) dressed, JP serosanguinous.  Genitourinary: Foley in place. Draining clear yellow urine. Negative genital edema.     I/O last 3 completed shifts: In: 1700 [I.V.:1700] Out: 165 [Urine:45; Drains:20; Blood:100]  Recent Labs     08/07/12  1920  08/08/12  0535  HGB  15.2  14.0  WBC  19.4*   --   PLT  231   --     Recent Labs     08/07/12  1920  08/08/12  0535  NA   --   139  K   --   4.0  CL   --   106  CO2   --   26  BUN   --   7  CREATININE  0.88  0.80  CALCIUM   --   8.3*  GFRNONAA  >90  >90  GFRAA  >90  >90     No results found for this basename: PT, INR, APTT,  in the last 72 hours   No components found with this basename: ABG,      Length of stay: 1 days.  Assessment: Prostate cancer POD#1 Radical robotic prostatectomy.   Plan: -Saline lock IV. -Ambulate. -Transition to PO pain medication. -Will try regular diet this morning. -Nurse to teach foley catheter care. -Discontinue JP. -Likely discharge home today. We reviewed discharge instructions.   Natalia Leatherwood, MD [phone]

## 2012-08-08 NOTE — Anesthesia Postprocedure Evaluation (Signed)
Anesthesia Post Note  Patient: Richard Holder  Procedure(s) Performed: Procedure(s) (LRB): ROBOTIC ASSISTED LAPAROSCOPIC RADICAL PROSTATECTOMY LEVEL 1 (N/A)  Anesthesia type: General  Patient location: PACU  Post pain: Pain level controlled  Post assessment: Post-op Vital signs reviewed  Last Vitals:  Filed Vitals:   08/08/12 0538  BP: 124/84  Pulse: 77  Temp: 36.9 C  Resp: 16    Post vital signs: Reviewed  Level of consciousness: sedated  Complications: No apparent anesthesia complications

## 2012-08-08 NOTE — Care Management Note (Signed)
    Page 1 of 1   08/08/2012     11:24:05 AM   CARE MANAGEMENT NOTE 08/08/2012  Patient:  Richard Holder, Richard Holder   Account Number:  [id number]  Date Initiated:  08/08/2012  Documentation initiated by:  Lanier Clam  Subjective/Objective Assessment:   ADMITTED W/PROSTATE CA.ELEVATED PSA.     Action/Plan:   FROM HOME.   Anticipated DC Date:  08/08/2012   Anticipated DC Plan:  HOME/SELF CARE      DC Planning Services  CM consult      Choice offered to / List presented to:             Status of service:  Completed, signed off Medicare Important Message given?   (If response is "NO", the following Medicare IM given date fields will be blank) Date Medicare IM given:   Date Additional Medicare IM given:    Discharge Disposition:  HOME/SELF CARE  Per UR Regulation:  Reviewed for med. necessity/level of care/duration of stay  If discussed at Long Length of Stay Meetings, dates discussed:    Comments:  08/08/12 Savon Cobbs RN,BSN NCM [phone] POD#1 RADICAL PROSTATECTOMY.

## 2012-08-08 NOTE — Discharge Summary (Signed)
Physician Discharge Summary  Patient ID: Richard Holder MRN: [id number] DOB/AGE: 07/19/1965 47 y.o.  Admit date: 08/07/2012 Discharge date: 08/08/2012  Admission Diagnoses: Prostate cancer  Discharge Diagnoses:  Prostate cancer  Discharged Condition: good  Hospital Course:  This patient was admitted for extended observation following radical robotic prostatectomy with bilateral nerve sparing approach. He was able to control his pain with IV medications and then oral medications. He was able to ambulate early and tolerated a regular diet. He did not have nausea or vomiting. JP drain output was low and the drain was discontinued. He was taught Foley catheter care by his nursing staff. He felt that he could be discharged home and I agreed.  Consults: None  Significant Diagnostic Studies: None  Treatments: surgery: Robotic radical prostatectomy.  Discharge Exam: Blood pressure 124/84, pulse 77, temperature 98.4 F (36.9 C), temperature source Oral, resp. rate 16, height 5\' 11"  (1.803 m), weight 83.915 kg (185 lb), SpO2 94.00%. Refer to PE from progress note on date of discharge.  Disposition: Home self-care.  Discharge Orders   Future Orders Complete By Expires     Discharge patient  As directed         Medication List    TAKE these medications       atenolol 50 MG tablet  Commonly known as:  TENORMIN  Take 50 mg by mouth at bedtime.     bisacodyl 10 MG suppository  Commonly known as:  DULCOLAX  Place 1 suppository (10 mg total) rectally 2 (two) times daily.     ciprofloxacin 500 MG tablet  Commonly known as:  CIPRO  Take 1 tablet (500 mg total) by mouth 2 (two) times daily. Begin the day before your catheter is to be removed.     clonazePAM 0.5 MG tablet  Commonly known as:  KLONOPIN  Take 0.5 mg by mouth at bedtime.     FLUoxetine 40 MG capsule  Commonly known as:  PROZAC  Take 40 mg by mouth at bedtime.     hyoscyamine 0.125 MG tablet  Commonly known as:   LEVSIN, ANASPAZ  Take 1 tablet (0.125 mg total) by mouth every 4 (four) hours as needed for cramping (bladder spasms).     neomycin-bacitracin-polymyxin ointment  Commonly known as:  NEOSPORIN  Apply topically 3 (three) times daily. apply to tip of penis.     oxybutynin 5 MG tablet  Commonly known as:  DITROPAN  Take 1 tablet (5 mg total) by mouth every 6 (six) hours as needed (bladder spams).     oxyCODONE-acetaminophen 5-325 MG per tablet  Commonly known as:  PERCOCET  Take 1-2 tablets by mouth every 4 (four) hours as needed for pain.     senna-docusate 8.6-50 MG per tablet  Commonly known as:  SENOKOT S  Take 1 tablet by mouth 2 (two) times daily.           Follow-up Information   Follow up with Richard Cage, MD On 08/19/2012. (9:15 am.)    Contact information:   [address] [place] [address]LO[address], [place] [address] [phone]       Signed: [name] 08/08/2012, 12:46 PM

## 2012-08-20 ENCOUNTER — Ambulatory Visit (HOSPITAL_COMMUNITY)
Admission: RE | Admit: 2012-08-20 | Discharge: 2012-08-20 | Disposition: A | Discharge status: Home / Self Care | Payer: 59 | Source: Ambulatory Visit | Attending: Urology | Admitting: Urology

## 2012-08-20 ENCOUNTER — Other Ambulatory Visit (HOSPITAL_COMMUNITY): Payer: Self-pay | Admitting: Urology

## 2012-08-20 DIAGNOSIS — IMO0002 Reserved for concepts with insufficient information to code with codable children: Secondary | ICD-10-CM

## 2012-08-20 DIAGNOSIS — R918 Other nonspecific abnormal finding of lung field: Secondary | ICD-10-CM | POA: Insufficient documentation

## 2013-09-05 ENCOUNTER — Encounter: Payer: Self-pay | Admitting: Radiation Oncology

## 2013-09-05 NOTE — Progress Notes (Signed)
GU Location of Tumor / Histology: prostate  If Prostate Cancer, Gleason Score is (3 + 4) and PSA is (0.05 on 08/22/13) 05/22/13 PSA 0.04 02/19/13 PSA 0.10 11/15/12 PSA 0.05  Richard Holder presented <1 month ago with signs/symptoms of: rising PSA s/p prostatectomy  Biopsies of prostate (if applicable) revealed:  7/90/38 FINAL DIAGNOSIS Diagnosis Prostate, radical resection - PROSTATIC ADENOCARCINOMA, GLEASON GRADE 3+4=7. - PERINEURAL INVASION PRESENT. - INVASIVE TUMOR IS PRESENT IN BOTH PROSTATIC LOBES. - INVASIVE TUMOR IS PRESENT AT CAUTERIZED SURGICAL MARGIN. - RIGHT AND LEFT APEX, NEGATIVE FOR TUMOR. - RIGHT AND LEFT BLADDER BASE, NEGATIVE FOR TUMOR. - RIGHT AND LEFT SEMINAL VESICLES, NEGATIVE FOR TUMOR. - SEE TUMOR TEMPLATE BELOW. Microscopic Comment PROSTATE RADICAL RESECTION/PROSTATECTOMY Histologic type: Adenocarcinoma Gleasons Score: 3+4=7 Tertiary score (if applicable): N/A Involving (half lobe or less, one or both lobes): Both lobes (left lobe = 30%; right lobe = less than 1%), see comment Extraprostatic extension: Tumor broadly present at cauterized margin, see comment Involvement of apex: Negative Margins: Positive, see comment Seminal vesicles: Negative for tumor, see comment Lymph-Vascular invasion: Absent Treatment effect: None Lymph nodes: number examined 0 ; number positive N/A  Past/Anticipated interventions by urology, if any: 08/08/12 radical prostatectomy  Past/Anticipated interventions by medical oncology, if any: none  Weight changes, if any:No   Bowel/Bladder complaints, if any: No problems with urination or bowels all back to normal.   Nausea/Vomiting, if any: No  Pain issues, if any:No    SAFETY ISSUES:  Prior radiation? No  Pacemaker/ICD? No  Possible current pregnancy? na  Is the patient on methotrexate? 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

## 2013-09-10 ENCOUNTER — Encounter: Payer: Self-pay | Admitting: Radiation Oncology

## 2013-09-10 ENCOUNTER — Ambulatory Visit
Admission: RE | Admit: 2013-09-10 | Discharge: 2013-09-10 | Disposition: A | Discharge status: Home / Self Care | Payer: 59 | Source: Ambulatory Visit | Attending: Radiation Oncology | Admitting: Radiation Oncology

## 2013-09-10 VITALS — BP 121/76 | HR 79 | Temp 98.9°F | Wt 190.5 lb

## 2013-09-10 DIAGNOSIS — C61 Malignant neoplasm of prostate: Secondary | ICD-10-CM | POA: Diagnosis not present

## 2013-09-10 DIAGNOSIS — I10 Essential (primary) hypertension: Secondary | ICD-10-CM | POA: Insufficient documentation

## 2013-09-10 DIAGNOSIS — F3289 Other specified depressive episodes: Secondary | ICD-10-CM | POA: Diagnosis not present

## 2013-09-10 DIAGNOSIS — F329 Major depressive disorder, single episode, unspecified: Secondary | ICD-10-CM | POA: Diagnosis not present

## 2013-09-10 DIAGNOSIS — N529 Male erectile dysfunction, unspecified: Secondary | ICD-10-CM | POA: Insufficient documentation

## 2013-09-10 DIAGNOSIS — F411 Generalized anxiety disorder: Secondary | ICD-10-CM | POA: Insufficient documentation

## 2013-09-10 DIAGNOSIS — Z9079 Acquired absence of other genital organ(s): Secondary | ICD-10-CM | POA: Diagnosis not present

## 2013-09-10 DIAGNOSIS — Z87891 Personal history of nicotine dependence: Secondary | ICD-10-CM | POA: Diagnosis not present

## 2013-09-10 DIAGNOSIS — Z510 Encounter for antineoplastic radiation therapy: Secondary | ICD-10-CM | POA: Insufficient documentation

## 2013-09-10 HISTORY — DX: Malignant neoplasm of prostate: C61

## 2013-09-10 HISTORY — DX: Gout, unspecified: M10.9

## 2013-09-10 NOTE — Progress Notes (Signed)
[facility] Radiation Oncology NEW PATIENT EVALUATION  Name: Richard Holder MRN: [id number]  Date:   09/10/2013           DOB: 08-15-1965  Status: outpatient   CC: Richard Apo, MD  Richard Ache, MD    REFERRING PHYSICIAN: Sebastian Ache, MD   DIAGNOSIS: Pathologic stage T3a NX adenocarcinoma prostate   HISTORY OF PRESENT ILLNESS:  Richard Holder is a 48 y.o. male who is seen today for the courtesy of Dr. Berneice Holder for discussion of adjuvant/salvage radiation therapy in the management of his pathologic stageT3a adenocarcinoma prostate. He was noted to have a PSA of 2.83 in November 2010, rising to 3.17 by December 2012, and then 4.83 in February 2014. He was seen by Dr. Margarita Holder who performed ultrasound-guided biopsies on 06/04/2012. He was found to have Gleason 6 (3+3) involving 60% of one core from left lateral base, 40% of one core from the left base, and less than 5% of one core from the left mid gland. His gland volume was 26 cc. He underwent a bilateral nerve sparing radical robotic prostatectomy by Dr. Margarita Holder on 08/07/2012. On review of his pathology he was found have Gleason 7 (3+4) involving both prostatic lobes, primarily on the left. Invasive tumor was probably present along the left posterior base and tumor was seen at the cauterized tissue edge/margin and was given a pathologic stage of T3a. He did well postoperatively and had early control of his urination. He does have erectile dysfunction which he manages with every other day Cialis and Viagra. His postoperative PSAs have fluctuated from 0.05 in October 2014 to 0.1 by January 2015, 0.04 by April 2015 and 0.05 on 08/21/2013. Dr. Berneice Holder assumed the patient's care and has him seen today for discussion of possible adjuvant/salvage radiation therapy.  PREVIOUS RADIATION THERAPY: No   PAST MEDICAL HISTORY:  has a past medical history of Hypertension; Anxiety; Depression; Pneumonia; Cancer; Prostate cancer (06/04/12);  and Gout.     PAST SURGICAL HISTORY:  Past Surgical History  Procedure Laterality Date  . Skin cancer removed Left     /shoulder/benign  . Robot assisted laparoscopic radical prostatectomy N/A 08/07/2012    Procedure: ROBOTIC ASSISTED LAPAROSCOPIC RADICAL PROSTATECTOMY LEVEL 1;  Surgeon: Richard Cage, MD;  Location: [facility]S;  Service: Urology;  Laterality: N/A;  . Prostate biopsy  06/04/12    gleason 6     FAMILY HISTORY: family history includes Alcoholism in his father. His father died of alcoholism and 58. His mother is alive and well at 45. No family history prostate cancer.   SOCIAL HISTORY:  reports that he quit smoking about 13 months ago. His smoking use included Cigarettes. He has a 30 pack-year smoking history. He has never used smokeless tobacco. He reports that he does not drink alcohol or use illicit drugs. Married, no children. He works in Airline pilot.   ALLERGIES: Review of patient's allergies indicates no known allergies.   MEDICATIONS:  Current Outpatient Prescriptions  Medication Sig Dispense Refill  . atenolol (TENORMIN) 50 MG tablet Take 50 mg by mouth at bedtime.      . bisacodyl (DULCOLAX) 10 MG suppository Place 1 suppository (10 mg total) rectally 2 (two) times daily.  12 suppository  0  . clonazePAM (KLONOPIN) 0.5 MG tablet Take 0.5 mg by mouth at bedtime.      Marland Kitchen FLUoxetine (PROZAC) 40 MG capsule Take 40 mg by mouth at bedtime.      . hyoscyamine (LEVSIN, ANASPAZ)  0.125 MG tablet Take 1 tablet (0.125 mg total) by mouth every 4 (four) hours as needed for cramping (bladder spasms).  40 tablet  4  . neomycin-bacitracin-polymyxin (NEOSPORIN) ointment Apply topically 3 (three) times daily. apply to tip of penis.  15 g  0  . oxybutynin (DITROPAN) 5 MG tablet Take 1 tablet (5 mg total) by mouth every 6 (six) hours as needed (bladder spams).  40 tablet  4  . oxyCODONE-acetaminophen (PERCOCET) 5-325 MG per tablet Take 1-2 tablets by mouth every 4 (four) hours as  needed for pain.  50 tablet  0  . senna-docusate (SENOKOT S) 8.6-50 MG per tablet Take 1 tablet by mouth 2 (two) times daily.  60 tablet  0   No current facility-administered medications for this encounter.     REVIEW OF SYSTEMS:  Pertinent items are noted in HPI.    PHYSICAL EXAM:  weight is 190 lb 8 oz (86.41 kg). His temperature is 98.9 F (37.2 C). His blood pressure is 121/76 and his pulse is 79.   Alert and oriented 48 year old white male appearing his stated age. Head and neck examination: Grossly unremarkable. Nodes: Without palpable cervical or supraclavicular lymphadenopathy. Chest: Lungs clear. Abdomen: Without masses organomegaly. Genitalia: Unremarkable to inspection. Rectal: The prostate bed is flat, but surgical clips are palpable along the left prostate bed. Extremities: Without edema.   LABORATORY DATA:  Lab Results  Component Value Date   WBC 19.4* 08/07/2012   HGB 14.0 08/08/2012   HCT 42.1 08/08/2012   MCV 90.7 08/07/2012   PLT 231 08/07/2012   Lab Results  Component Value Date   NA 139 08/08/2012   K 4.0 08/08/2012   CL 106 08/08/2012   CO2 26 08/08/2012   Lab Results  Component Value Date   ALT 154* 03/05/2008   AST 84* 03/05/2008   ALKPHOS 78 03/05/2008   BILITOT 1.6* 03/05/2008   PSA 0.05 from 08/21/2013   IMPRESSION: Pathologic stage T3a NX intermediate risk adenocarcinoma prostate. He appears to have had stage T3a disease which would be an indication for "adjuvant" radiation therapy based on the SWOG trial. His PSA is barely detectable, and without a constant rise in his PSA we cannot be 100% sure that he has a biochemical recurrence. A PSA level of 0.2 has been historically used as a definition for a biochemical recurrence. Considering his young age with low level PSA fluctuation in the setting of what probably is pathologic stageT3a disease I would certainly offer him adjuvant/salvage radiation therapy. I discussed with him the possibility of waiting for his PSA  to show a consistent rise or reach 0.2 before beginning "salvage" radiation therapy. We discussed the potential acute and late toxicities of radiation therapy which is generally well tolerated. After lengthy discussion he is most interested in proceeding with radiation therapy this fall. Consent is signed today. We discussed the CT simulation/treatment planning process and the benefit of comfortable bladder filling to minimize urinary toxicity.   PLAN: We will get him set up for CT simulation this fall.  I spent 60 minutes minutes face to face with the patient and more than 50% of that time was spent in counseling and/or coordination of care.

## 2013-09-10 NOTE — Progress Notes (Signed)
Please see the Nurse Progress Note in the MD Initial Consult Encounter for this patient. 

## 2013-12-01 ENCOUNTER — Other Ambulatory Visit: Payer: Self-pay | Admitting: Radiation Oncology

## 2013-12-08 ENCOUNTER — Ambulatory Visit
Admission: RE | Admit: 2013-12-08 | Discharge: 2013-12-08 | Disposition: A | Discharge status: Home / Self Care | Payer: 59 | Source: Ambulatory Visit | Attending: Radiation Oncology | Admitting: Radiation Oncology

## 2013-12-08 DIAGNOSIS — Z510 Encounter for antineoplastic radiation therapy: Secondary | ICD-10-CM | POA: Insufficient documentation

## 2013-12-08 DIAGNOSIS — C61 Malignant neoplasm of prostate: Secondary | ICD-10-CM | POA: Insufficient documentation

## 2013-12-08 NOTE — Progress Notes (Signed)
Complex simulation/treatment planning note: The patient was taken to the CT similar. A Vac lock immobilization device was constructed. A red rubber tube was placed within the rectal vault. He was then catheterized and contrast instilled into the bladder/urethra. He was then scanned. I chose an isocenter along the prostate bed. The CT data set was sent to the MIM planning system where I contoured his CTV 66 (high risk prostate bed) and expanded this by 0.5 cm to create PTV 66 which will receive 6600 cGy in 33 sessions. I also contoured his bladder, rectum, and rectosigmoid colon. He is now ready for IMRT simulation/treatment planning.

## 2013-12-15 ENCOUNTER — Encounter: Payer: Self-pay | Admitting: Radiation Oncology

## 2013-12-15 DIAGNOSIS — Z510 Encounter for antineoplastic radiation therapy: Secondary | ICD-10-CM | POA: Diagnosis not present

## 2013-12-15 NOTE — Progress Notes (Signed)
IMRT simulation/treatment planning note: The patient underwent IMRT treatment planning today in the management of his carcinoma the prostate. IMRT was chosen to decrease the risk for both acute and late bladder and rectal toxicity compared to conventional or 3-D conformal radiation therapy. Dose volume histograms were obtained for the target structure, high risk prostate bed (PTV 66) which will receive 6600 cGy in 33 sessions. Dose volume histograms were also obtained for the avoidance structures including the bladder, rectum, and femoral heads. We met our departmental guidelines. He is being treated with 6 MV photons, dual ARC VMAT IMRT.

## 2013-12-16 DIAGNOSIS — Z510 Encounter for antineoplastic radiation therapy: Secondary | ICD-10-CM | POA: Diagnosis not present

## 2013-12-17 ENCOUNTER — Ambulatory Visit: Payer: 59

## 2013-12-17 ENCOUNTER — Ambulatory Visit
Admission: RE | Admit: 2013-12-17 | Discharge: 2013-12-17 | Disposition: A | Discharge status: Home / Self Care | Payer: 59 | Source: Ambulatory Visit | Attending: Radiation Oncology | Admitting: Radiation Oncology

## 2013-12-17 DIAGNOSIS — Z510 Encounter for antineoplastic radiation therapy: Secondary | ICD-10-CM | POA: Diagnosis not present

## 2013-12-17 DIAGNOSIS — C61 Malignant neoplasm of prostate: Secondary | ICD-10-CM

## 2013-12-17 NOTE — Progress Notes (Signed)
Chart note: The patient began IMRT to his prostate bed with dual ARC VMAT IMRT. He is treated with 2 sets of dynamic MLCs corresponding to one set of IMRT treatment devices [id number]).

## 2013-12-18 ENCOUNTER — Ambulatory Visit
Admission: RE | Admit: 2013-12-18 | Discharge: 2013-12-18 | Disposition: A | Discharge status: Home / Self Care | Payer: 59 | Source: Ambulatory Visit | Attending: Radiation Oncology | Admitting: Radiation Oncology

## 2013-12-18 ENCOUNTER — Inpatient Hospital Stay
Admission: RE | Admit: 2013-12-18 | Discharge: 2013-12-18 | Disposition: A | Discharge status: Home / Self Care | Payer: Self-pay | Source: Ambulatory Visit | Attending: Radiation Oncology | Admitting: Radiation Oncology

## 2013-12-18 DIAGNOSIS — C61 Malignant neoplasm of prostate: Secondary | ICD-10-CM

## 2013-12-18 DIAGNOSIS — Z510 Encounter for antineoplastic radiation therapy: Secondary | ICD-10-CM | POA: Diagnosis not present

## 2013-12-18 NOTE — Progress Notes (Signed)
Patient education completed with patient. Gave him "Radiation and You" booklet with all pertinent information marked and discussed, re: fatigue, rectal irritation/care, urinary/bladder irritation/management, nutrition, pain. Teach back used. Pt verbalized understanding.

## 2013-12-19 ENCOUNTER — Ambulatory Visit
Admission: RE | Admit: 2013-12-19 | Discharge: 2013-12-19 | Disposition: A | Discharge status: Home / Self Care | Payer: 59 | Source: Ambulatory Visit | Attending: Radiation Oncology | Admitting: Radiation Oncology

## 2013-12-19 DIAGNOSIS — Z510 Encounter for antineoplastic radiation therapy: Secondary | ICD-10-CM | POA: Diagnosis not present

## 2013-12-22 ENCOUNTER — Ambulatory Visit
Admission: RE | Admit: 2013-12-22 | Discharge: 2013-12-22 | Disposition: A | Discharge status: Home / Self Care | Payer: 59 | Source: Ambulatory Visit | Attending: Radiation Oncology | Admitting: Radiation Oncology

## 2013-12-22 VITALS — BP 109/74 | HR 78 | Temp 98.2°F | Resp 14 | Wt 194.2 lb

## 2013-12-22 DIAGNOSIS — C61 Malignant neoplasm of prostate: Secondary | ICD-10-CM

## 2013-12-22 DIAGNOSIS — Z510 Encounter for antineoplastic radiation therapy: Secondary | ICD-10-CM | POA: Diagnosis not present

## 2013-12-22 NOTE — Progress Notes (Signed)
   Weekly Management Note:  outpatient    ICD-9-CM ICD-10-CM   1. Malignant neoplasm of prostate 185 C61     Current Dose:  8 Gy  Projected Dose: 66 Gy   Narrative:  The patient presents for routine under treatment assessment.  CBCT/MVCT images/Port film x-rays were reviewed.  The chart was checked. Tired but otherwise well  Physical Findings:  weight is 194 lb 3.2 oz (88.089 kg). His oral temperature is 98.2 F (36.8 C). His blood pressure is 109/74 and his pulse is 78. His respiration is 14 and oxygen saturation is 99%.  NAD  Impression:  The patient is tolerating radiotherapy.  Plan:  Continue radiotherapy as planned.    ________________________________   Eppie Gibson, M.D.

## 2013-12-22 NOTE — Progress Notes (Signed)
He is currently in no pain. Pt complains of, Fatigue. Reports taking 1 hr nap during the day frequently. Reports denies having urinary abnormalities.  Pt states they urinate 0 - 1 times per night.  Pt reports, a bowel movement every day. The patient eats a regular, healthy diet.  Reports using vaporizer delivery of nicotine, reports using 10 ml a day.

## 2013-12-23 ENCOUNTER — Ambulatory Visit
Admission: RE | Admit: 2013-12-23 | Discharge: 2013-12-23 | Disposition: A | Discharge status: Home / Self Care | Payer: 59 | Source: Ambulatory Visit | Attending: Radiation Oncology | Admitting: Radiation Oncology

## 2013-12-23 DIAGNOSIS — Z510 Encounter for antineoplastic radiation therapy: Secondary | ICD-10-CM | POA: Diagnosis not present

## 2013-12-24 ENCOUNTER — Ambulatory Visit
Admission: RE | Admit: 2013-12-24 | Discharge: 2013-12-24 | Disposition: A | Discharge status: Home / Self Care | Payer: 59 | Source: Ambulatory Visit | Attending: Radiation Oncology | Admitting: Radiation Oncology

## 2013-12-24 DIAGNOSIS — Z510 Encounter for antineoplastic radiation therapy: Secondary | ICD-10-CM | POA: Diagnosis not present

## 2013-12-25 ENCOUNTER — Ambulatory Visit
Admission: RE | Admit: 2013-12-25 | Discharge: 2013-12-25 | Disposition: A | Discharge status: Home / Self Care | Payer: 59 | Source: Ambulatory Visit | Attending: Radiation Oncology | Admitting: Radiation Oncology

## 2013-12-25 DIAGNOSIS — Z510 Encounter for antineoplastic radiation therapy: Secondary | ICD-10-CM | POA: Diagnosis not present

## 2013-12-26 ENCOUNTER — Ambulatory Visit
Admission: RE | Admit: 2013-12-26 | Discharge: 2013-12-26 | Disposition: A | Discharge status: Home / Self Care | Payer: 59 | Source: Ambulatory Visit | Attending: Radiation Oncology | Admitting: Radiation Oncology

## 2013-12-26 DIAGNOSIS — Z510 Encounter for antineoplastic radiation therapy: Secondary | ICD-10-CM | POA: Diagnosis not present

## 2013-12-29 ENCOUNTER — Ambulatory Visit
Admission: RE | Admit: 2013-12-29 | Discharge: 2013-12-29 | Disposition: A | Discharge status: Home / Self Care | Payer: 59 | Source: Ambulatory Visit | Attending: Radiation Oncology | Admitting: Radiation Oncology

## 2013-12-29 ENCOUNTER — Encounter: Payer: Self-pay | Admitting: Radiation Oncology

## 2013-12-29 VITALS — BP 121/81 | HR 62 | Temp 98.9°F | Resp 18

## 2013-12-29 DIAGNOSIS — Z510 Encounter for antineoplastic radiation therapy: Secondary | ICD-10-CM | POA: Diagnosis not present

## 2013-12-29 DIAGNOSIS — C61 Malignant neoplasm of prostate: Secondary | ICD-10-CM

## 2013-12-29 NOTE — Progress Notes (Signed)
Weekly Management Note:  Site:prostate bed Current Dose:  1800  cGy Projected Dose: 6600  cGy  Narrative: The patient is seen today for routine under treatment assessment. CBCT/MVCT images/port films were reviewed. The chart was reviewed.   Bladder filling is satisfactory, but could be improved.  Physical Examination:  Filed Vitals:   12/29/13 0923  BP: 121/81  Pulse: 62  Temp: 98.9 F (37.2 C)  Resp: 18  .  Weight:  . No change.  Impression: Tolerating radiation therapy well.  Plan: Continue radiation therapy as planned.

## 2013-12-29 NOTE — Progress Notes (Signed)
Patient denies pain, fatigue, loss of appetite, urinary/bowel issues.

## 2013-12-30 ENCOUNTER — Ambulatory Visit
Admission: RE | Admit: 2013-12-30 | Discharge: 2013-12-30 | Disposition: A | Discharge status: Home / Self Care | Payer: 59 | Source: Ambulatory Visit | Attending: Radiation Oncology | Admitting: Radiation Oncology

## 2013-12-30 DIAGNOSIS — Z510 Encounter for antineoplastic radiation therapy: Secondary | ICD-10-CM | POA: Diagnosis not present

## 2013-12-31 ENCOUNTER — Ambulatory Visit
Admission: RE | Admit: 2013-12-31 | Discharge: 2013-12-31 | Disposition: A | Discharge status: Home / Self Care | Payer: 59 | Source: Ambulatory Visit | Attending: Radiation Oncology | Admitting: Radiation Oncology

## 2013-12-31 DIAGNOSIS — Z510 Encounter for antineoplastic radiation therapy: Secondary | ICD-10-CM | POA: Diagnosis not present

## 2014-01-01 ENCOUNTER — Ambulatory Visit
Admission: RE | Admit: 2014-01-01 | Discharge: 2014-01-01 | Disposition: A | Discharge status: Home / Self Care | Payer: 59 | Source: Ambulatory Visit | Attending: Radiation Oncology | Admitting: Radiation Oncology

## 2014-01-01 DIAGNOSIS — Z510 Encounter for antineoplastic radiation therapy: Secondary | ICD-10-CM | POA: Diagnosis not present

## 2014-01-02 ENCOUNTER — Ambulatory Visit
Admission: RE | Admit: 2014-01-02 | Discharge: 2014-01-02 | Disposition: A | Discharge status: Home / Self Care | Payer: 59 | Source: Ambulatory Visit | Attending: Radiation Oncology | Admitting: Radiation Oncology

## 2014-01-02 DIAGNOSIS — Z510 Encounter for antineoplastic radiation therapy: Secondary | ICD-10-CM | POA: Diagnosis not present

## 2014-01-04 ENCOUNTER — Ambulatory Visit
Admission: RE | Admit: 2014-01-04 | Discharge: 2014-01-04 | Disposition: A | Discharge status: Home / Self Care | Payer: 59 | Source: Ambulatory Visit | Attending: Radiation Oncology | Admitting: Radiation Oncology

## 2014-01-04 DIAGNOSIS — Z510 Encounter for antineoplastic radiation therapy: Secondary | ICD-10-CM | POA: Diagnosis not present

## 2014-01-05 ENCOUNTER — Ambulatory Visit
Admission: RE | Admit: 2014-01-05 | Discharge: 2014-01-05 | Disposition: A | Discharge status: Home / Self Care | Payer: 59 | Source: Ambulatory Visit | Attending: Radiation Oncology | Admitting: Radiation Oncology

## 2014-01-05 VITALS — BP 124/83 | HR 58 | Temp 97.7°F | Wt 196.4 lb

## 2014-01-05 DIAGNOSIS — C61 Malignant neoplasm of prostate: Secondary | ICD-10-CM

## 2014-01-05 DIAGNOSIS — Z510 Encounter for antineoplastic radiation therapy: Secondary | ICD-10-CM | POA: Diagnosis not present

## 2014-01-05 NOTE — Progress Notes (Signed)
Patient for weekly under treat visit for radiation to prostate fossa.Completed 15 of 33 treatments.Urinary patterns and bowels are fine.Increased fatigue.No fatigue.

## 2014-01-05 NOTE — Progress Notes (Signed)
Weekly Management Note:  Site: Prostate bed Current Dose:  3000  cGy Projected Dose: 6600  cGy  Narrative: The patient is seen today for routine under treatment assessment. CBCT/MVCT images/port films were reviewed. The chart was reviewed.   Bladder filling is less than optimal but satisfactory.  No GU or GI difficulties.  Physical Examination:  Filed Vitals:   01/05/14 0912  BP: 124/83  Pulse: 58  Temp: 97.7 F (36.5 C)  .  Weight: 196 lb 6.4 oz (89.086 kg).  No change.  Impression: Tolerating radiation therapy well.  I encouraged him to improve his bladder filling.  Plan: Continue radiation therapy as planned.

## 2014-01-06 ENCOUNTER — Ambulatory Visit
Admission: RE | Admit: 2014-01-06 | Discharge: 2014-01-06 | Disposition: A | Discharge status: Home / Self Care | Payer: 59 | Source: Ambulatory Visit | Attending: Radiation Oncology | Admitting: Radiation Oncology

## 2014-01-06 DIAGNOSIS — Z510 Encounter for antineoplastic radiation therapy: Secondary | ICD-10-CM | POA: Diagnosis not present

## 2014-01-07 ENCOUNTER — Ambulatory Visit
Admission: RE | Admit: 2014-01-07 | Discharge: 2014-01-07 | Disposition: A | Discharge status: Home / Self Care | Payer: 59 | Source: Ambulatory Visit | Attending: Radiation Oncology | Admitting: Radiation Oncology

## 2014-01-07 DIAGNOSIS — Z510 Encounter for antineoplastic radiation therapy: Secondary | ICD-10-CM | POA: Diagnosis not present

## 2014-01-09 ENCOUNTER — Ambulatory Visit: Payer: 59

## 2014-01-12 ENCOUNTER — Encounter: Payer: Self-pay | Admitting: Radiation Oncology

## 2014-01-12 ENCOUNTER — Ambulatory Visit
Admission: RE | Admit: 2014-01-12 | Discharge: 2014-01-12 | Disposition: A | Discharge status: Home / Self Care | Payer: 59 | Source: Ambulatory Visit | Attending: Radiation Oncology | Admitting: Radiation Oncology

## 2014-01-12 VITALS — BP 123/78 | HR 61 | Temp 98.6°F | Resp 20 | Wt 199.8 lb

## 2014-01-12 DIAGNOSIS — Z510 Encounter for antineoplastic radiation therapy: Secondary | ICD-10-CM | POA: Diagnosis not present

## 2014-01-12 DIAGNOSIS — C61 Malignant neoplasm of prostate: Secondary | ICD-10-CM

## 2014-01-12 NOTE — Progress Notes (Signed)
Patient denies pain, fatigue, urinary/bowel issues, loss of appetite.  

## 2014-01-12 NOTE — Progress Notes (Signed)
Weekly Management Note:  Site: Prostate bed Current Dose:  3600  cGy Projected Dose: 6600  cGy  Narrative: The patient is seen today for routine under treatment assessment. CBCT/MVCT images/port films were reviewed. The chart was reviewed.   Bladder filling is satisfactory but could be improved.  No GU or GI difficulties.  Physical Examination:  Filed Vitals:   01/12/14 0916  BP: 123/78  Pulse: 61  Temp: 98.6 F (37 C)  Resp: 20  .  Weight: 199 lb 12.8 oz (90.629 kg).  No change.  Impression: Tolerating radiation therapy well.  Plan: Continue radiation therapy as planned.

## 2014-01-13 ENCOUNTER — Ambulatory Visit
Admission: RE | Admit: 2014-01-13 | Discharge: 2014-01-13 | Disposition: A | Discharge status: Home / Self Care | Payer: 59 | Source: Ambulatory Visit | Attending: Radiation Oncology | Admitting: Radiation Oncology

## 2014-01-13 DIAGNOSIS — Z510 Encounter for antineoplastic radiation therapy: Secondary | ICD-10-CM | POA: Diagnosis not present

## 2014-01-14 ENCOUNTER — Ambulatory Visit
Admission: RE | Admit: 2014-01-14 | Discharge: 2014-01-14 | Disposition: A | Discharge status: Home / Self Care | Payer: 59 | Source: Ambulatory Visit | Attending: Radiation Oncology | Admitting: Radiation Oncology

## 2014-01-14 DIAGNOSIS — Z510 Encounter for antineoplastic radiation therapy: Secondary | ICD-10-CM | POA: Diagnosis not present

## 2014-01-15 ENCOUNTER — Ambulatory Visit
Admission: RE | Admit: 2014-01-15 | Discharge: 2014-01-15 | Disposition: A | Discharge status: Home / Self Care | Payer: 59 | Source: Ambulatory Visit | Attending: Radiation Oncology | Admitting: Radiation Oncology

## 2014-01-15 DIAGNOSIS — Z510 Encounter for antineoplastic radiation therapy: Secondary | ICD-10-CM | POA: Diagnosis not present

## 2014-01-16 ENCOUNTER — Ambulatory Visit: Payer: 59

## 2014-01-19 ENCOUNTER — Ambulatory Visit: Payer: 59 | Admitting: Radiation Oncology

## 2014-01-19 ENCOUNTER — Ambulatory Visit: Payer: 59

## 2014-01-20 ENCOUNTER — Encounter: Payer: Self-pay | Admitting: Radiation Oncology

## 2014-01-20 ENCOUNTER — Ambulatory Visit
Admission: RE | Admit: 2014-01-20 | Discharge: 2014-01-20 | Disposition: A | Discharge status: Home / Self Care | Payer: 59 | Source: Ambulatory Visit | Attending: Radiation Oncology | Admitting: Radiation Oncology

## 2014-01-20 ENCOUNTER — Ambulatory Visit: Payer: 59

## 2014-01-20 VITALS — BP 116/75 | HR 71 | Temp 98.1°F | Resp 18 | Wt 195.6 lb

## 2014-01-20 DIAGNOSIS — Z510 Encounter for antineoplastic radiation therapy: Secondary | ICD-10-CM | POA: Diagnosis not present

## 2014-01-20 DIAGNOSIS — C61 Malignant neoplasm of prostate: Secondary | ICD-10-CM

## 2014-01-20 NOTE — Progress Notes (Signed)
Patient denies pain, urinary/bowel issues, loss of appetite. He states his energy level has improved in the past week.

## 2014-01-20 NOTE — Progress Notes (Signed)
Weekly Management Note:  Site: Prostate bed Current Dose:  4600  cGy Projected Dose: 6600  cGy  Narrative: The patient is seen today for routine under treatment assessment. CBCT/MVCT images/port films were reviewed. The chart was reviewed.   Bladder filling is suboptimal today.  No new GU or GI difficulty.  Physical Examination:  Filed Vitals:   01/20/14 0914  BP: 116/75  Pulse: 71  Temp: 98.1 F (36.7 C)  Resp: 18  .  Weight: 195 lb 9.6 oz (88.724 kg).  No change.  Impression: Tolerating radiation therapy well.  I instructed him to improve his bladder filling.  Plan: Continue radiation therapy as planned.

## 2014-01-21 ENCOUNTER — Ambulatory Visit
Admission: RE | Admit: 2014-01-21 | Discharge: 2014-01-21 | Disposition: A | Discharge status: Home / Self Care | Payer: 59 | Source: Ambulatory Visit | Attending: Radiation Oncology | Admitting: Radiation Oncology

## 2014-01-21 DIAGNOSIS — Z510 Encounter for antineoplastic radiation therapy: Secondary | ICD-10-CM | POA: Diagnosis not present

## 2014-01-22 ENCOUNTER — Ambulatory Visit
Admission: RE | Admit: 2014-01-22 | Discharge: 2014-01-22 | Disposition: A | Discharge status: Home / Self Care | Payer: 59 | Source: Ambulatory Visit | Attending: Radiation Oncology | Admitting: Radiation Oncology

## 2014-01-22 ENCOUNTER — Ambulatory Visit: Payer: 59

## 2014-01-22 DIAGNOSIS — Z510 Encounter for antineoplastic radiation therapy: Secondary | ICD-10-CM | POA: Diagnosis not present

## 2014-01-23 ENCOUNTER — Ambulatory Visit
Admission: RE | Admit: 2014-01-23 | Discharge: 2014-01-23 | Disposition: A | Discharge status: Home / Self Care | Payer: 59 | Source: Ambulatory Visit | Attending: Radiation Oncology | Admitting: Radiation Oncology

## 2014-01-23 DIAGNOSIS — Z510 Encounter for antineoplastic radiation therapy: Secondary | ICD-10-CM | POA: Diagnosis not present

## 2014-01-26 ENCOUNTER — Other Ambulatory Visit: Payer: Self-pay | Admitting: Nurse Practitioner

## 2014-01-26 ENCOUNTER — Encounter (INDEPENDENT_AMBULATORY_CARE_PROVIDER_SITE_OTHER): Payer: Self-pay

## 2014-01-26 ENCOUNTER — Ambulatory Visit
Admission: RE | Admit: 2014-01-26 | Discharge: 2014-01-26 | Disposition: A | Discharge status: Home / Self Care | Payer: 59 | Source: Ambulatory Visit | Attending: Nurse Practitioner | Admitting: Nurse Practitioner

## 2014-01-26 ENCOUNTER — Encounter: Payer: Self-pay | Admitting: Radiation Oncology

## 2014-01-26 ENCOUNTER — Ambulatory Visit
Admission: RE | Admit: 2014-01-26 | Discharge: 2014-01-26 | Disposition: A | Discharge status: Home / Self Care | Payer: 59 | Source: Ambulatory Visit | Attending: Radiation Oncology | Admitting: Radiation Oncology

## 2014-01-26 VITALS — BP 126/74 | HR 60 | Temp 97.9°F | Resp 20 | Wt 197.7 lb

## 2014-01-26 DIAGNOSIS — M542 Cervicalgia: Secondary | ICD-10-CM

## 2014-01-26 DIAGNOSIS — C61 Malignant neoplasm of prostate: Secondary | ICD-10-CM

## 2014-01-26 DIAGNOSIS — Z510 Encounter for antineoplastic radiation therapy: Secondary | ICD-10-CM | POA: Diagnosis not present

## 2014-01-26 NOTE — Progress Notes (Signed)
Weekly Management Note:  Site: Prostate bed  Current Dose:  5600  cGy Projected Dose: 6600  cGy  Narrative: The patient is seen today for routine under treatment assessment. CBCT/MVCT images/port films were reviewed. The chart was reviewed.   Bladder filling is excellent.  He seems to have a "stiff" left neck with pain radiating down the left shoulder and upper extremity.  This has been present for over a week.  He has been taking nonsteroidal anti-inflammatory drugs without much benefit.  No new GU or GI difficulty.  Physical Examination:  Filed Vitals:   01/26/14 0945  BP: 126/74  Pulse: 60  Temp: 97.9 F (36.6 C)  Resp: 20  .  Weight: 197 lb 11.2 oz (89.676 kg).  No change.  There is no palpable left neck discomfort.  Impression: Tolerating radiation therapy well, however, he does have persistent left neck pain which is felt to be unrelated to his underlying malignancy.  He may have a pinched nerve or herniated disc.  He'll see his primary care physician, Dr. Delfina Redwood for further evaluation.  Plan: Continue radiation therapy as planned.

## 2014-01-26 NOTE — Progress Notes (Signed)
Patient continues to have pain in his left neck which he reports is radiating down his left arm. He reports some tingling in his left hand. He states he slept on a "new bed away from home". He states he has had a "stiff neck in the past, but "it never lasted this long". He has been taking Advil, using heat with no relief. Advised he may try Aleve and alternating heat with ice, but if no relief in a few days, suggested he call his PCP. He denies urinary/bowel issues, fatigue, loss of appetite.

## 2014-01-27 ENCOUNTER — Ambulatory Visit
Admission: RE | Admit: 2014-01-27 | Discharge: 2014-01-27 | Disposition: A | Discharge status: Home / Self Care | Payer: 59 | Source: Ambulatory Visit | Attending: Radiation Oncology | Admitting: Radiation Oncology

## 2014-01-27 DIAGNOSIS — Z510 Encounter for antineoplastic radiation therapy: Secondary | ICD-10-CM | POA: Diagnosis not present

## 2014-01-28 ENCOUNTER — Ambulatory Visit
Admission: RE | Admit: 2014-01-28 | Discharge: 2014-01-28 | Disposition: A | Discharge status: Home / Self Care | Payer: 59 | Source: Ambulatory Visit | Attending: Radiation Oncology | Admitting: Radiation Oncology

## 2014-01-28 DIAGNOSIS — Z510 Encounter for antineoplastic radiation therapy: Secondary | ICD-10-CM | POA: Diagnosis not present

## 2014-01-29 ENCOUNTER — Ambulatory Visit
Admission: RE | Admit: 2014-01-29 | Discharge: 2014-01-29 | Disposition: A | Discharge status: Home / Self Care | Payer: 59 | Source: Ambulatory Visit | Attending: Radiation Oncology | Admitting: Radiation Oncology

## 2014-01-29 DIAGNOSIS — Z510 Encounter for antineoplastic radiation therapy: Secondary | ICD-10-CM | POA: Diagnosis not present

## 2014-01-29 DIAGNOSIS — C61 Malignant neoplasm of prostate: Secondary | ICD-10-CM

## 2014-01-29 NOTE — Progress Notes (Addendum)
Weekly Management Note:  Site: Prostate bed Current Dose:  6200  cGy Projected Dose: 6600  cGy  Narrative: The patient is seen today for routine under treatment assessment. CBCT/MVCT images/port films were reviewed. The chart was reviewed.   Bladder filling is excellent.  No new GU or GI difficulty.  Physical Examination: There were no vitals filed for this visit..  Weight:  .  No change.  Impression: Tolerating radiation therapy well.  Plan: Continue radiation therapy as planned.  He'll finish his treatment this Monday and then return to see me for a follow-up visit in one month.

## 2014-01-30 ENCOUNTER — Ambulatory Visit
Admission: RE | Admit: 2014-01-30 | Discharge: 2014-01-30 | Disposition: A | Discharge status: Home / Self Care | Payer: 59 | Source: Ambulatory Visit | Attending: Radiation Oncology | Admitting: Radiation Oncology

## 2014-01-30 ENCOUNTER — Ambulatory Visit: Payer: 59

## 2014-01-30 DIAGNOSIS — Z510 Encounter for antineoplastic radiation therapy: Secondary | ICD-10-CM | POA: Diagnosis not present

## 2014-02-02 ENCOUNTER — Ambulatory Visit
Admission: RE | Admit: 2014-02-02 | Discharge: 2014-02-02 | Disposition: A | Discharge status: Home / Self Care | Payer: 59 | Source: Ambulatory Visit | Attending: Radiation Oncology | Admitting: Radiation Oncology

## 2014-02-02 ENCOUNTER — Ambulatory Visit: Payer: 59

## 2014-02-02 DIAGNOSIS — Z510 Encounter for antineoplastic radiation therapy: Secondary | ICD-10-CM | POA: Diagnosis not present

## 2014-02-03 ENCOUNTER — Ambulatory Visit: Payer: 59

## 2014-02-07 ENCOUNTER — Encounter: Payer: Self-pay | Admitting: Radiation Oncology

## 2014-02-07 NOTE — Progress Notes (Signed)
[facility] End of Treatment Note  Name:Richard Holder  Date: 02/07/2014 QBV:[id number] DOB:1965-04-24   Status:outpatient    CC: Kandice Hams, MD  Dr. Phebe Colla  REFERRING PHYSICIAN:   Dr. Phebe Colla   DIAGNOSIS: Pathologic stage T3a NX adenocarcinoma prostate  INDICATION FOR TREATMENT: Curative   TREATMENT DATES: 12/17/2013 through 02/02/2014                          SITE/DOSE:  Prostate bed 6600 cGy in 33 sessions                          BEAMS/ENERGY:     6 MV photons, dual ARC VMAT IMRT              NARRATIVE:   The patient tolerated treatment well with no significant GU or GI toxicity by completion of therapy.                         PLAN: Routine followup in one month. Patient instructed to call if questions or worsening complaints in interim.

## 2014-03-02 ENCOUNTER — Encounter: Payer: Self-pay | Admitting: Radiation Oncology

## 2014-03-03 ENCOUNTER — Ambulatory Visit
Admission: RE | Admit: 2014-03-03 | Discharge: 2014-03-03 | Disposition: A | Discharge status: Home / Self Care | Payer: 59 | Source: Ambulatory Visit | Attending: Radiation Oncology | Admitting: Radiation Oncology

## 2014-03-03 DIAGNOSIS — C61 Malignant neoplasm of prostate: Secondary | ICD-10-CM

## 2014-03-03 HISTORY — DX: Personal history of irradiation: Z92.3

## 2014-03-03 NOTE — Progress Notes (Signed)
Mr. Applegate reports no concerns with urination today.

## 2014-03-03 NOTE — Progress Notes (Signed)
CC: Dr. Phebe Colla  Follow-up note:  The patient returns today approximately 1 month following completion of radiation therapy in the management of his pathologic stage T3a adenocarcinoma prostate.  He is doing well from a GU and GI standpoint.  He does not yet have an appointment to see Dr. Tresa Moore.  Physical examination: Alert and oriented.There were no vitals filed for this visit. He is not examined today.  Impression: Satisfactory progress.  I told the patient that he should be hearing from Dr. Zettie Pho office regarding a follow-up visit and PSA determination.  Plan: Follow-up through Dr. Tresa Moore.  I've not scheduled the patient for a formal follow-up visit and I ask that Dr. Tresa Moore keep me posted on his progress.

## 2014-04-09 ENCOUNTER — Other Ambulatory Visit: Payer: Self-pay | Admitting: Dermatology

## 2015-02-22 MED FILL — FLUoxetine HCL 40 MG CAPS: 40 | 90 days supply | Qty: 90 | Fill #3

## 2015-03-17 DIAGNOSIS — H524 Presbyopia: Secondary | ICD-10-CM | POA: Diagnosis not present

## 2015-03-17 DIAGNOSIS — H52223 Regular astigmatism, bilateral: Secondary | ICD-10-CM | POA: Diagnosis not present

## 2015-03-17 DIAGNOSIS — H5213 Myopia, bilateral: Secondary | ICD-10-CM | POA: Diagnosis not present

## 2015-03-18 MED FILL — CIALIS 5 MG TABLET: 5 | 30 days supply | Qty: 30 | Fill #5

## 2015-04-21 MED FILL — clonazePAM 0.5 MG TABS: 0.5 | 90 days supply | Qty: 90 | Fill #0

## 2015-04-22 MED FILL — CIALIS 5 MG TABLET: 5 | 30 days supply | Qty: 30 | Fill #6

## 2015-05-13 MED FILL — FLUoxetine HCL 40 MG CAPS: 40 | 90 days supply | Qty: 90 | Fill #0

## 2015-05-27 DIAGNOSIS — C61 Malignant neoplasm of prostate: Secondary | ICD-10-CM | POA: Diagnosis not present

## 2015-06-02 MED FILL — CIALIS 5 MG TABLET: 5 | 30 days supply | Qty: 30 | Fill #7

## 2015-06-03 DIAGNOSIS — Z0000 Encounter for general adult medical examination without abnormal findings: Secondary | ICD-10-CM | POA: Diagnosis not present

## 2015-06-03 DIAGNOSIS — N5201 Erectile dysfunction due to arterial insufficiency: Secondary | ICD-10-CM | POA: Diagnosis not present

## 2015-06-03 DIAGNOSIS — N393 Stress incontinence (female) (male): Secondary | ICD-10-CM | POA: Diagnosis not present

## 2015-06-03 DIAGNOSIS — C61 Malignant neoplasm of prostate: Secondary | ICD-10-CM | POA: Diagnosis not present

## 2015-07-13 DIAGNOSIS — Z0000 Encounter for general adult medical examination without abnormal findings: Secondary | ICD-10-CM | POA: Diagnosis not present

## 2015-07-22 MED FILL — clonazePAM 0.5 MG TABS: 0.5 | 90 days supply | Qty: 90 | Fill #0

## 2015-08-23 MED FILL — FLUoxetine HCL 40 MG CAPS: 40 | 90 days supply | Qty: 90 | Fill #1

## 2015-08-31 DIAGNOSIS — N5201 Erectile dysfunction due to arterial insufficiency: Secondary | ICD-10-CM | POA: Diagnosis not present

## 2015-08-31 DIAGNOSIS — C61 Malignant neoplasm of prostate: Secondary | ICD-10-CM | POA: Diagnosis not present

## 2015-09-22 IMAGING — CR DG CERVICAL SPINE COMPLETE 4+V
6 series · 6 of 6 positions shown · non-contrast
Comparison: None.

CLINICAL DATA: Neck pain, left arm pain

EXAM:
CERVICAL SPINE  4+ VIEWS

[w c-spine lat]
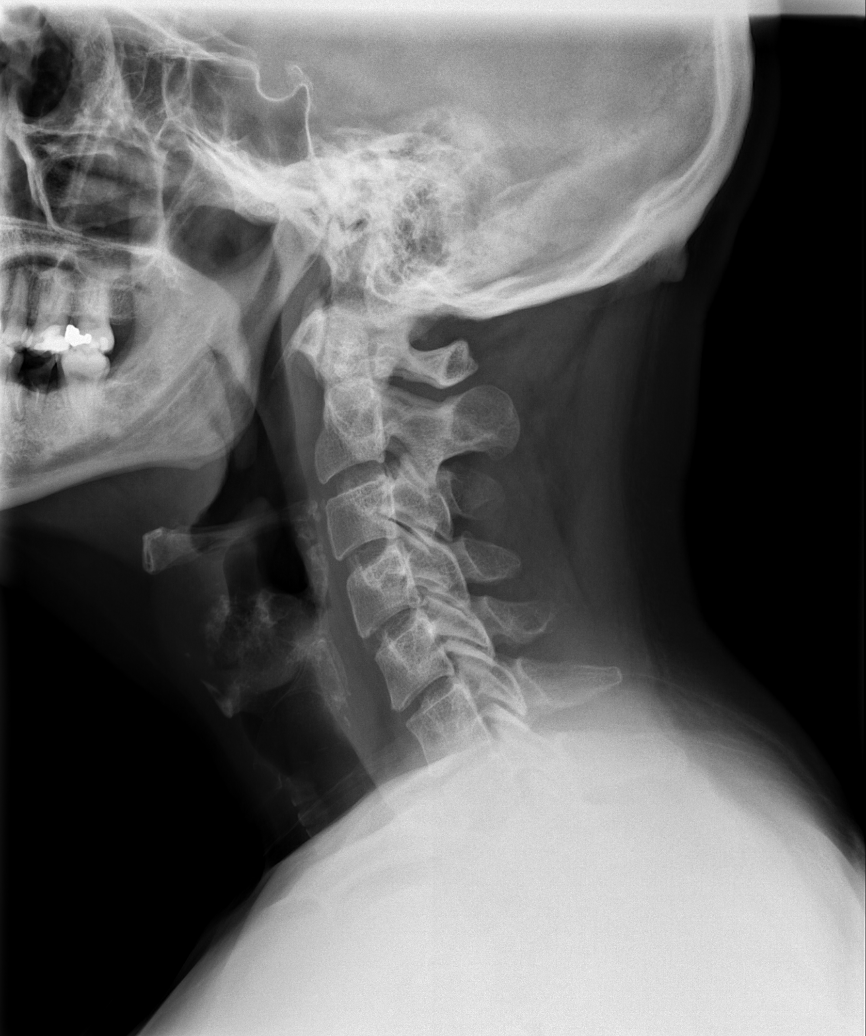

[w c-spine oblique (1 of 2)]
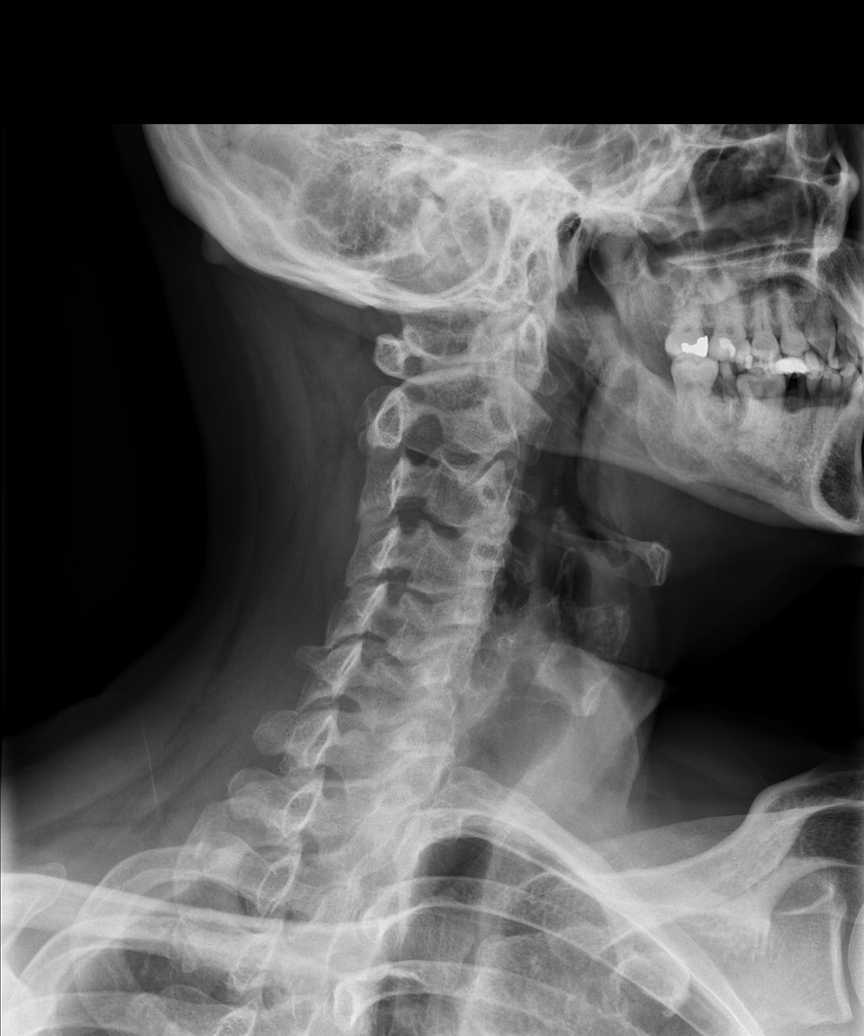

[w c-spine oblique (2 of 2)]
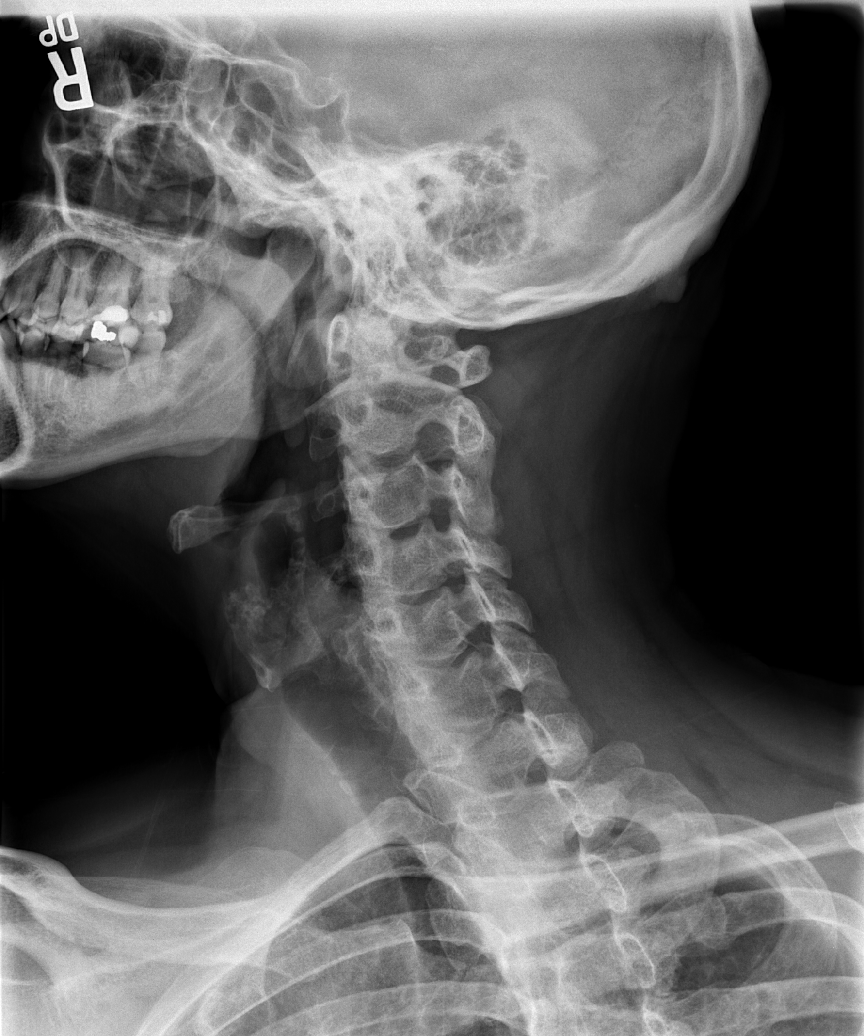

[w c-spine a.p. *]
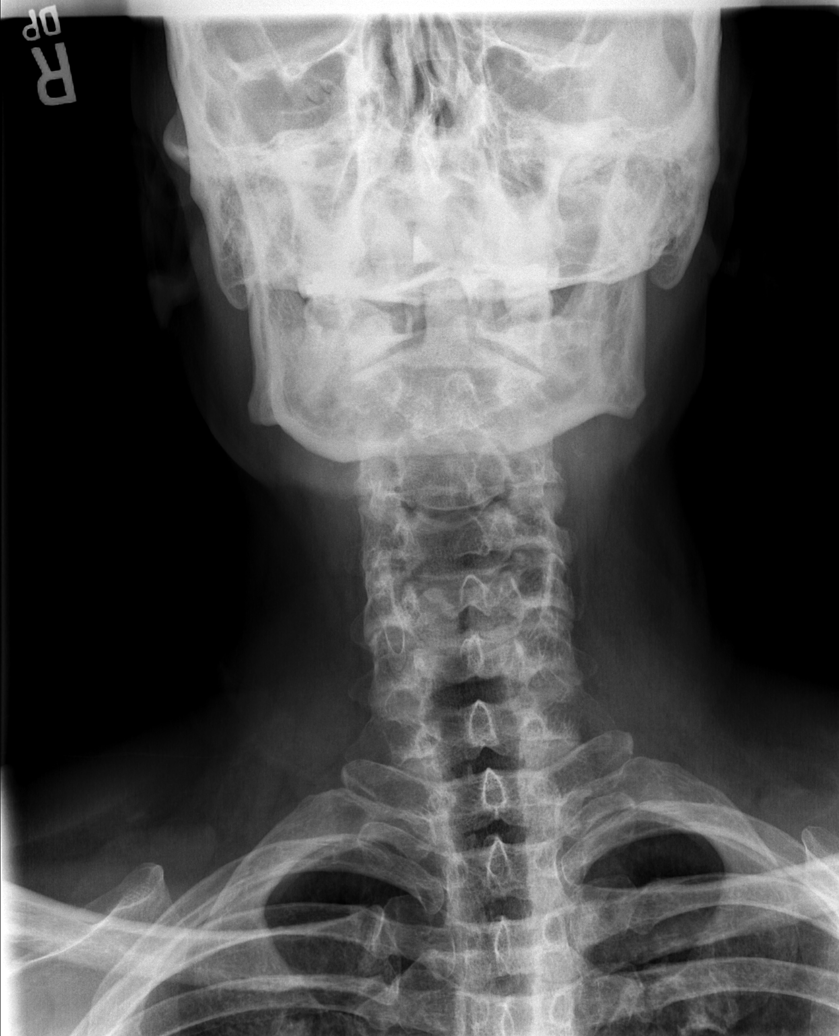

[w c-spine odontoid *]
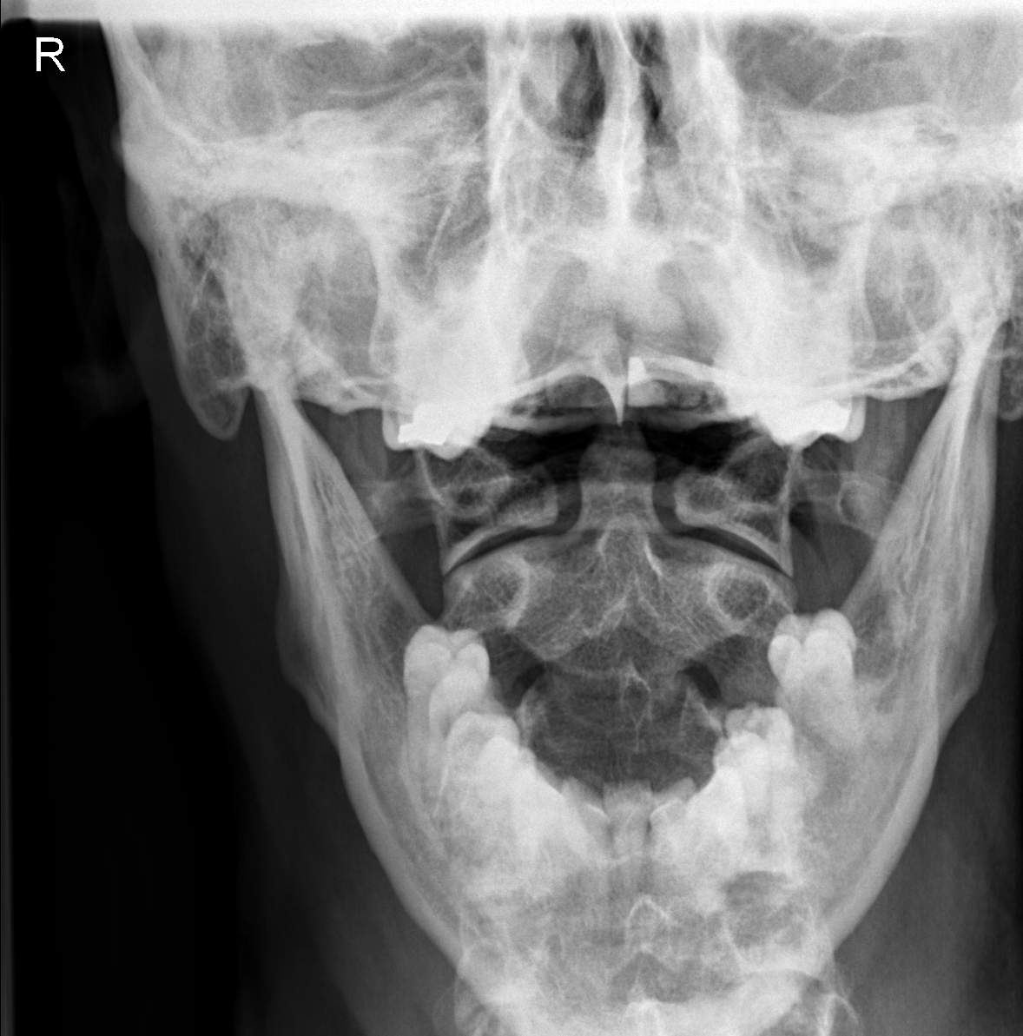

[w swimmers view *]
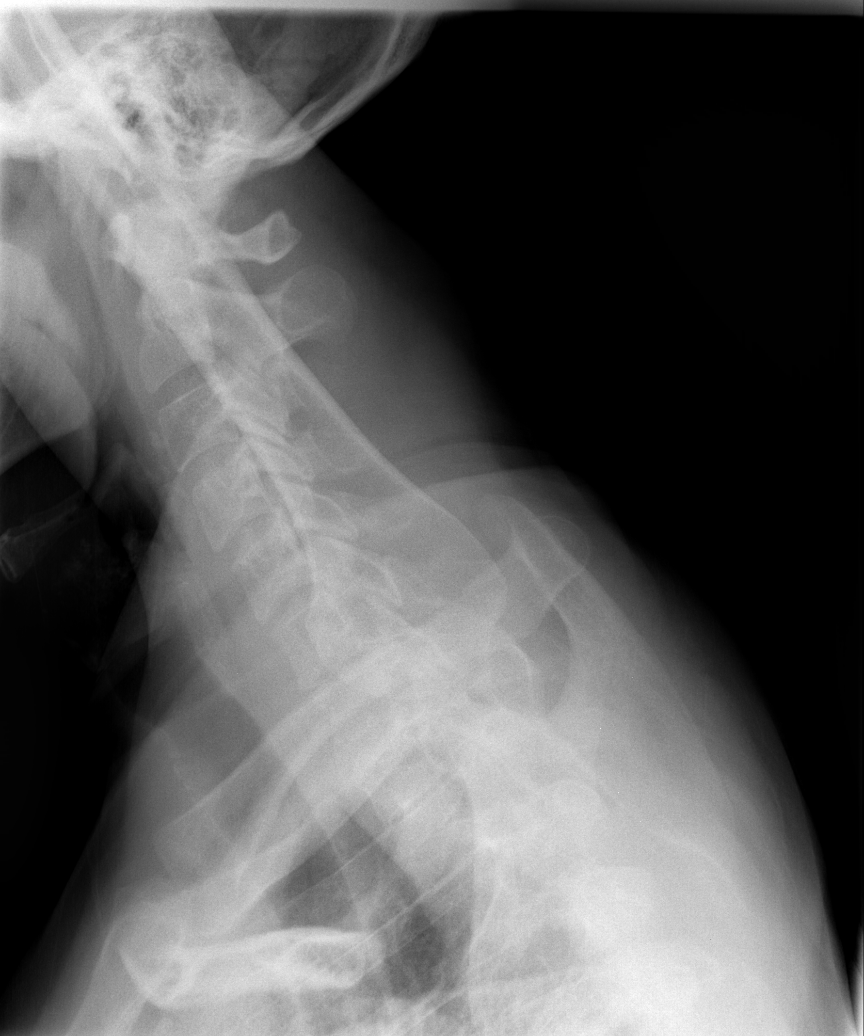

[6 of 6 positions shown; findings below may reference images not displayed]

FINDINGS: There is no evidence of cervical spine fracture or prevertebral soft
tissue swelling. Alignment is normal. No other significant bone
abnormalities are identified.
IMPRESSION: Negative cervical spine radiographs.

## 2015-10-04 DIAGNOSIS — C61 Malignant neoplasm of prostate: Secondary | ICD-10-CM | POA: Diagnosis not present

## 2015-10-04 DIAGNOSIS — Z0000 Encounter for general adult medical examination without abnormal findings: Secondary | ICD-10-CM | POA: Diagnosis not present

## 2015-10-04 DIAGNOSIS — F419 Anxiety disorder, unspecified: Secondary | ICD-10-CM | POA: Diagnosis not present

## 2015-10-04 DIAGNOSIS — M109 Gout, unspecified: Secondary | ICD-10-CM | POA: Diagnosis not present

## 2015-10-04 DIAGNOSIS — N529 Male erectile dysfunction, unspecified: Secondary | ICD-10-CM | POA: Diagnosis not present

## 2015-10-19 MED FILL — clonazePAM 0.5 MG TABS: 0.5 | 90 days supply | Qty: 90 | Fill #0

## 2015-11-17 DIAGNOSIS — Z23 Encounter for immunization: Secondary | ICD-10-CM | POA: Diagnosis not present

## 2015-11-17 MED FILL — GAVILYTE-N SOLUTION: 420 | 1 days supply | Qty: 4000 | Fill #0

## 2015-11-22 DIAGNOSIS — D126 Benign neoplasm of colon, unspecified: Secondary | ICD-10-CM | POA: Diagnosis not present

## 2015-11-22 DIAGNOSIS — Z1211 Encounter for screening for malignant neoplasm of colon: Secondary | ICD-10-CM | POA: Diagnosis not present

## 2015-11-22 DIAGNOSIS — D122 Benign neoplasm of ascending colon: Secondary | ICD-10-CM | POA: Diagnosis not present

## 2015-11-24 MED FILL — FLUoxetine HCL 40 MG CAPS: 40 | 90 days supply | Qty: 90 | Fill #0

## 2016-01-19 MED FILL — COLCHICINE 0.6 MG TABLET: 0.6 | 90 days supply | Qty: 90 | Fill #0

## 2016-01-19 MED FILL — clonazePAM 0.5 MG TABS: 0.5 | 90 days supply | Qty: 90 | Fill #0

## 2016-01-19 MED FILL — INDOMETHACIN 50 MG CAPSULE: 50 | 90 days supply | Qty: 180 | Fill #0

## 2016-02-24 MED FILL — FLUoxetine HCL 40 MG CAPS: 40 | 90 days supply | Qty: 90 | Fill #1

## 2016-04-11 DIAGNOSIS — H5213 Myopia, bilateral: Secondary | ICD-10-CM | POA: Diagnosis not present

## 2016-04-11 DIAGNOSIS — H52223 Regular astigmatism, bilateral: Secondary | ICD-10-CM | POA: Diagnosis not present

## 2016-04-17 MED FILL — clonazePAM 0.5 MG TABS: 0.5 | 90 days supply | Qty: 90 | Fill #1

## 2016-05-16 MED FILL — AZITHROMYCIN 500 MG TABLET: 500 | 3 days supply | Qty: 3 | Fill #0

## 2016-05-16 MED FILL — ZOLPIDEM TARTRATE 10 MG TAB: 10 | 10 days supply | Qty: 10 | Fill #0

## 2016-05-22 MED FILL — FLUoxetine HCL 40 MG CAPS: 40 | 90 days supply | Qty: 90 | Fill #2

## 2016-06-26 DIAGNOSIS — G8929 Other chronic pain: Secondary | ICD-10-CM | POA: Diagnosis not present

## 2016-06-26 DIAGNOSIS — M25561 Pain in right knee: Secondary | ICD-10-CM | POA: Diagnosis not present

## 2016-06-26 DIAGNOSIS — M2550 Pain in unspecified joint: Secondary | ICD-10-CM | POA: Diagnosis not present

## 2016-07-13 MED FILL — clonazePAM 0.5 MG TABS: 0.5 | 90 days supply | Qty: 90 | Fill #0

## 2016-08-02 DIAGNOSIS — C61 Malignant neoplasm of prostate: Secondary | ICD-10-CM | POA: Diagnosis not present

## 2016-08-02 DIAGNOSIS — R3915 Urgency of urination: Secondary | ICD-10-CM | POA: Diagnosis not present

## 2016-08-02 DIAGNOSIS — R350 Frequency of micturition: Secondary | ICD-10-CM | POA: Diagnosis not present

## 2016-08-22 DIAGNOSIS — C61 Malignant neoplasm of prostate: Secondary | ICD-10-CM | POA: Diagnosis not present

## 2016-08-22 MED FILL — FLUoxetine HCL 40 MG CAPS: 40 | 90 days supply | Qty: 90 | Fill #3

## 2016-08-29 DIAGNOSIS — N393 Stress incontinence (female) (male): Secondary | ICD-10-CM | POA: Diagnosis not present

## 2016-08-29 DIAGNOSIS — C61 Malignant neoplasm of prostate: Secondary | ICD-10-CM | POA: Diagnosis not present

## 2016-08-29 DIAGNOSIS — N5201 Erectile dysfunction due to arterial insufficiency: Secondary | ICD-10-CM | POA: Diagnosis not present

## 2016-10-17 DIAGNOSIS — M109 Gout, unspecified: Secondary | ICD-10-CM | POA: Diagnosis not present

## 2016-10-17 DIAGNOSIS — F419 Anxiety disorder, unspecified: Secondary | ICD-10-CM | POA: Diagnosis not present

## 2016-10-17 DIAGNOSIS — Z136 Encounter for screening for cardiovascular disorders: Secondary | ICD-10-CM | POA: Diagnosis not present

## 2016-10-17 DIAGNOSIS — F329 Major depressive disorder, single episode, unspecified: Secondary | ICD-10-CM | POA: Diagnosis not present

## 2016-10-17 DIAGNOSIS — C61 Malignant neoplasm of prostate: Secondary | ICD-10-CM | POA: Diagnosis not present

## 2016-10-17 DIAGNOSIS — Z0000 Encounter for general adult medical examination without abnormal findings: Secondary | ICD-10-CM | POA: Diagnosis not present

## 2016-10-17 DIAGNOSIS — N529 Male erectile dysfunction, unspecified: Secondary | ICD-10-CM | POA: Diagnosis not present

## 2016-10-17 MED FILL — clonazePAM 0.5 MG TABS: 0.5 | 90 days supply | Qty: 90 | Fill #1

## 2016-11-21 MED FILL — FLUoxetine HCL 40 MG CAPS: 40 | 90 days supply | Qty: 90 | Fill #0

## 2016-11-30 DIAGNOSIS — Z23 Encounter for immunization: Secondary | ICD-10-CM | POA: Diagnosis not present

## 2017-01-16 MED FILL — clonazePAM 0.5 MG TABS: 0.5 | 90 days supply | Qty: 90 | Fill #0

## 2017-02-27 MED FILL — FLUoxetine HCL 40 MG CAPS: 40 | 90 days supply | Qty: 90 | Fill #1

## 2017-04-20 MED FILL — clonazePAM 0.5 MG TABS: 0.5 | 90 days supply | Qty: 90 | Fill #1

## 2017-05-30 MED FILL — FLUoxetine HCL 40 MG CAPS: 40 | 90 days supply | Qty: 90 | Fill #2

## 2017-07-20 MED FILL — clonazePAM 0.5 MG TABS: 0.5 | 90 days supply | Qty: 90 | Fill #0

## 2017-08-28 DIAGNOSIS — C61 Malignant neoplasm of prostate: Secondary | ICD-10-CM | POA: Diagnosis not present

## 2017-08-31 MED FILL — FLUoxetine HCL 40 MG CAPS: 40 | 90 days supply | Qty: 90 | Fill #3

## 2017-09-04 DIAGNOSIS — N393 Stress incontinence (female) (male): Secondary | ICD-10-CM | POA: Diagnosis not present

## 2017-09-04 DIAGNOSIS — R350 Frequency of micturition: Secondary | ICD-10-CM | POA: Diagnosis not present

## 2017-09-04 DIAGNOSIS — N5201 Erectile dysfunction due to arterial insufficiency: Secondary | ICD-10-CM | POA: Diagnosis not present

## 2017-09-04 DIAGNOSIS — C61 Malignant neoplasm of prostate: Secondary | ICD-10-CM | POA: Diagnosis not present

## 2017-10-23 MED FILL — clonazePAM 0.5 MG TABS: 0.5 | 90 days supply | Qty: 90 | Fill #1

## 2017-10-24 DIAGNOSIS — L218 Other seborrheic dermatitis: Secondary | ICD-10-CM | POA: Diagnosis not present

## 2017-10-24 MED FILL — TRIAMCINOLONE 0.1% CREAM: 0.1 | 30 days supply | Qty: 30 | Fill #0

## 2017-10-24 MED FILL — KETOCONAZOLE 2% CREAM: 2 | 14 days supply | Qty: 30 | Fill #0

## 2017-10-25 DIAGNOSIS — Z23 Encounter for immunization: Secondary | ICD-10-CM | POA: Diagnosis not present

## 2017-11-01 DIAGNOSIS — Z0000 Encounter for general adult medical examination without abnormal findings: Secondary | ICD-10-CM | POA: Diagnosis not present

## 2017-11-01 DIAGNOSIS — M109 Gout, unspecified: Secondary | ICD-10-CM | POA: Diagnosis not present

## 2017-11-01 DIAGNOSIS — Z13220 Encounter for screening for lipoid disorders: Secondary | ICD-10-CM | POA: Diagnosis not present

## 2017-11-01 MED FILL — INDOMETHACIN 50 MG CAPSULE: 50 | 30 days supply | Qty: 60 | Fill #0

## 2017-11-28 MED FILL — FLUoxetine HCL 40 MG CAPS: 40 | 90 days supply | Qty: 90 | Fill #0

## 2018-01-22 MED FILL — clonazePAM 0.5 MG TABS: 0.5 | 90 days supply | Qty: 90 | Fill #0

## 2018-02-25 MED FILL — FLUoxetine HCL 40 MG CAPS: 40 | 90 days supply | Qty: 90 | Fill #1

## 2018-04-03 DIAGNOSIS — B349 Viral infection, unspecified: Secondary | ICD-10-CM | POA: Diagnosis not present

## 2018-04-03 MED FILL — OSELTAMIVIR PHOSPHATE 75 MG: 75 | 5 days supply | Qty: 10 | Fill #0

## 2018-04-03 MED FILL — BENZONATATE 200 MG CAP: 200 | 10 days supply | Qty: 30 | Fill #0

## 2018-04-19 MED FILL — clonazePAM 0.5 MG TABS: 0.5 | 90 days supply | Qty: 90 | Fill #1

## 2018-05-27 MED FILL — FLUoxetine HCL 40 MG CAPS: 40 | 90 days supply | Qty: 90 | Fill #2

## 2018-07-10 DIAGNOSIS — N529 Male erectile dysfunction, unspecified: Secondary | ICD-10-CM | POA: Diagnosis not present

## 2018-07-11 DIAGNOSIS — F419 Anxiety disorder, unspecified: Secondary | ICD-10-CM | POA: Diagnosis not present

## 2018-07-11 DIAGNOSIS — N529 Male erectile dysfunction, unspecified: Secondary | ICD-10-CM | POA: Diagnosis not present

## 2018-07-11 DIAGNOSIS — E291 Testicular hypofunction: Secondary | ICD-10-CM | POA: Diagnosis not present

## 2018-07-19 MED FILL — clonazePAM 0.5 MG TABS: 0.5 | 90 days supply | Qty: 90 | Fill #0

## 2018-07-23 DIAGNOSIS — R6882 Decreased libido: Secondary | ICD-10-CM | POA: Diagnosis not present

## 2018-07-23 DIAGNOSIS — C61 Malignant neoplasm of prostate: Secondary | ICD-10-CM | POA: Diagnosis not present

## 2018-07-23 DIAGNOSIS — N393 Stress incontinence (female) (male): Secondary | ICD-10-CM | POA: Diagnosis not present

## 2018-07-23 DIAGNOSIS — N5201 Erectile dysfunction due to arterial insufficiency: Secondary | ICD-10-CM | POA: Diagnosis not present

## 2018-08-08 DIAGNOSIS — C61 Malignant neoplasm of prostate: Secondary | ICD-10-CM | POA: Diagnosis not present

## 2018-08-15 DIAGNOSIS — N393 Stress incontinence (female) (male): Secondary | ICD-10-CM | POA: Diagnosis not present

## 2018-08-15 DIAGNOSIS — E291 Testicular hypofunction: Secondary | ICD-10-CM | POA: Diagnosis not present

## 2018-08-15 DIAGNOSIS — R6882 Decreased libido: Secondary | ICD-10-CM | POA: Diagnosis not present

## 2018-08-15 DIAGNOSIS — C61 Malignant neoplasm of prostate: Secondary | ICD-10-CM | POA: Diagnosis not present

## 2018-08-20 MED FILL — TESTOSTERONE CYPIONATE 200: 200 | 14 days supply | Qty: 2 | Fill #0

## 2018-08-21 DIAGNOSIS — E291 Testicular hypofunction: Secondary | ICD-10-CM | POA: Diagnosis not present

## 2018-08-26 MED FILL — FLUoxetine HCL 40 MG CAPS: 40 | 90 days supply | Qty: 90 | Fill #3

## 2018-09-11 MED FILL — TESTOSTERONE CYP 200 MG/ML: 200 | 14 days supply | Qty: 2 | Fill #1

## 2018-10-11 MED FILL — TESTOSTERONE CYP 200 MG/ML: 200 | 14 days supply | Qty: 2 | Fill #2

## 2018-10-15 MED FILL — BD INSULIN SYR 0.5 ML 30GX1: 30G X 1/2" | 70 days supply | Qty: 10 | Fill #0

## 2018-10-17 MED FILL — clonazePAM 0.5 MG TABS: 0.5 | 90 days supply | Qty: 90 | Fill #0

## 2018-11-13 DIAGNOSIS — E291 Testicular hypofunction: Secondary | ICD-10-CM | POA: Diagnosis not present

## 2018-11-13 DIAGNOSIS — Z23 Encounter for immunization: Secondary | ICD-10-CM | POA: Diagnosis not present

## 2018-11-15 MED FILL — TESTOSTERONE CYP 200 MG/ML: 200 | 28 days supply | Qty: 4 | Fill #3

## 2018-11-19 DIAGNOSIS — E291 Testicular hypofunction: Secondary | ICD-10-CM | POA: Diagnosis not present

## 2018-11-19 DIAGNOSIS — C61 Malignant neoplasm of prostate: Secondary | ICD-10-CM | POA: Diagnosis not present

## 2018-11-19 DIAGNOSIS — N5201 Erectile dysfunction due to arterial insufficiency: Secondary | ICD-10-CM | POA: Diagnosis not present

## 2018-11-19 DIAGNOSIS — N393 Stress incontinence (female) (male): Secondary | ICD-10-CM | POA: Diagnosis not present

## 2018-11-26 MED FILL — FLUoxetine HCL 40 MG CAPS: 40 | 90 days supply | Qty: 90 | Fill #0

## 2018-11-28 DIAGNOSIS — Z23 Encounter for immunization: Secondary | ICD-10-CM | POA: Diagnosis not present

## 2018-11-28 DIAGNOSIS — Z13220 Encounter for screening for lipoid disorders: Secondary | ICD-10-CM | POA: Diagnosis not present

## 2018-11-28 DIAGNOSIS — Z0000 Encounter for general adult medical examination without abnormal findings: Secondary | ICD-10-CM | POA: Diagnosis not present

## 2018-12-26 DIAGNOSIS — M7501 Adhesive capsulitis of right shoulder: Secondary | ICD-10-CM | POA: Diagnosis not present

## 2018-12-26 DIAGNOSIS — M25511 Pain in right shoulder: Secondary | ICD-10-CM | POA: Diagnosis not present

## 2018-12-31 DIAGNOSIS — M25511 Pain in right shoulder: Secondary | ICD-10-CM | POA: Diagnosis not present

## 2019-01-13 MED FILL — TESTOSTERONE CYP 200 MG/ML: 200 | 28 days supply | Qty: 4 | Fill #4

## 2019-01-14 MED FILL — clonazePAM 0.5 MG TABS: 0.5 | 90 days supply | Qty: 90 | Fill #0

## 2019-02-20 MED FILL — TESTOSTERONE CYP 200 MG/ML: 200 | 28 days supply | Qty: 4 | Fill #0

## 2019-02-25 MED FILL — FLUoxetine HCL 40 MG CAPS: 40 | 90 days supply | Qty: 90 | Fill #1

## 2019-03-07 MED FILL — TESTOSTERONE CYP 200 MG/ML: 200 | 28 days supply | Qty: 4 | Fill #0

## 2019-04-18 MED FILL — clonazePAM 0.5 MG TABS: 0.5 | 90 days supply | Qty: 90 | Fill #0

## 2019-05-27 MED FILL — FLUoxetine HCL 40 MG CAPS: 40 | 90 days supply | Qty: 90 | Fill #2

## 2019-06-17 MED FILL — TESTOSTERONE CYP 200 MG/ML: 200 | 28 days supply | Qty: 4 | Fill #1

## 2019-07-15 MED FILL — clonazePAM 0.5 MG TABS: 0.5 | 90 days supply | Qty: 90 | Fill #0

## 2019-08-22 MED FILL — FLUoxetine HCL 40 MG CAPS: 40 | 90 days supply | Qty: 90 | Fill #3

## 2019-10-10 MED FILL — clonazePAM 0.5 MG TABS: 0.5 | 30 days supply | Qty: 30 | Fill #0

## 2019-11-04 MED FILL — KETOCONAZOLE 2% CREAM: 2 | 30 days supply | Qty: 30 | Fill #0

## 2019-11-04 MED FILL — BETAMETHASONE DP AUG 0.05%: 0.05 | 25 days supply | Qty: 50 | Fill #0

## 2019-11-13 MED FILL — TESTOSTERONE CYP 200 MG/ML: 200 | 56 days supply | Qty: 8 | Fill #0

## 2019-11-14 MED FILL — clonazePAM 0.5 MG TABS: 0.5 | 30 days supply | Qty: 30 | Fill #0

## 2019-11-20 ENCOUNTER — Other Ambulatory Visit (HOSPITAL_COMMUNITY): Payer: Self-pay | Admitting: Internal Medicine

## 2019-11-20 MED FILL — FLUoxetine HCL 40 MG CAPS: 40 | 90 days supply | Qty: 90 | Fill #0

## 2019-12-10 ENCOUNTER — Other Ambulatory Visit (HOSPITAL_COMMUNITY): Payer: Self-pay | Admitting: Internal Medicine

## 2019-12-12 MED FILL — clonazePAM 0.5 MG TABS: 0.5 | 30 days supply | Qty: 30 | Fill #0

## 2020-01-02 ENCOUNTER — Other Ambulatory Visit (HOSPITAL_COMMUNITY): Payer: Self-pay | Admitting: Internal Medicine

## 2020-01-02 MED FILL — INDOMETHACIN 50 MG CAPSULE: 50 | 30 days supply | Qty: 60 | Fill #0

## 2020-01-02 MED FILL — MITIGARE 0.6 MG CAPSULE: 0.6 | 30 days supply | Qty: 30 | Fill #0

## 2020-01-09 MED FILL — clonazePAM 0.5 MG TABS: 0.5 | 30 days supply | Qty: 30 | Fill #1

## 2020-02-09 ENCOUNTER — Other Ambulatory Visit (HOSPITAL_COMMUNITY): Payer: Self-pay | Admitting: Internal Medicine

## 2020-02-09 MED FILL — clonazePAM 0.5 MG TABS: 0.5 | 30 days supply | Qty: 30 | Fill #0

## 2020-02-18 MED FILL — FLUoxetine HCL 40 MG CAPS: 40 | 90 days supply | Qty: 90 | Fill #1

## 2020-03-17 ENCOUNTER — Other Ambulatory Visit (HOSPITAL_COMMUNITY): Payer: Self-pay | Admitting: Internal Medicine

## 2020-03-17 MED FILL — clonazePAM 0.5 MG TABS: 0.5 | 30 days supply | Qty: 30 | Fill #0

## 2020-04-13 ENCOUNTER — Other Ambulatory Visit (HOSPITAL_COMMUNITY): Payer: Self-pay | Admitting: Internal Medicine

## 2020-04-14 MED FILL — clonazePAM 0.5 MG TABS: 0.5 | 90 days supply | Qty: 90 | Fill #0

## 2020-05-10 ENCOUNTER — Other Ambulatory Visit (HOSPITAL_COMMUNITY): Payer: Self-pay | Admitting: Urology

## 2020-05-10 MED FILL — TESTOSTERONE CYP 200 MG/ML: 200 | 56 days supply | Qty: 8 | Fill #0

## 2020-05-16 MED FILL — Fluoxetine HCl Cap 40 MG: ORAL | 90 days supply | Qty: 90 | Fill #0 | Status: AC

## 2020-05-17 ENCOUNTER — Other Ambulatory Visit (HOSPITAL_COMMUNITY): Payer: Self-pay

## 2020-07-12 ENCOUNTER — Other Ambulatory Visit (HOSPITAL_COMMUNITY): Payer: Self-pay

## 2020-07-13 ENCOUNTER — Other Ambulatory Visit (HOSPITAL_COMMUNITY): Payer: Self-pay

## 2020-07-14 ENCOUNTER — Other Ambulatory Visit (HOSPITAL_COMMUNITY): Payer: Self-pay

## 2020-07-14 MED ORDER — CLONAZEPAM 0.5 MG PO TABS
0.5000 mg | ORAL_TABLET | Freq: Every evening | ORAL | 0 refills | Status: DC
  Filled 2020-07-14: qty 90, 90d supply, fill #0

## 2020-07-16 ENCOUNTER — Other Ambulatory Visit (HOSPITAL_COMMUNITY): Payer: Self-pay

## 2020-08-17 ENCOUNTER — Other Ambulatory Visit (HOSPITAL_COMMUNITY): Payer: Self-pay

## 2020-08-17 MED FILL — Fluoxetine HCl Cap 40 MG: ORAL | 90 days supply | Qty: 90 | Fill #1 | Status: AC

## 2020-08-18 ENCOUNTER — Other Ambulatory Visit (HOSPITAL_COMMUNITY): Payer: Self-pay

## 2020-08-19 ENCOUNTER — Other Ambulatory Visit (HOSPITAL_COMMUNITY): Payer: Self-pay

## 2020-09-03 ENCOUNTER — Other Ambulatory Visit (HOSPITAL_COMMUNITY): Payer: Self-pay

## 2020-09-03 MED FILL — Testosterone Cypionate IM Inj in Oil 200 MG/ML: INTRAMUSCULAR | 56 days supply | Qty: 8 | Fill #0 | Status: AC

## 2020-09-06 ENCOUNTER — Other Ambulatory Visit (HOSPITAL_COMMUNITY): Payer: Self-pay

## 2020-10-04 ENCOUNTER — Other Ambulatory Visit (HOSPITAL_COMMUNITY): Payer: Self-pay

## 2020-10-05 ENCOUNTER — Other Ambulatory Visit (HOSPITAL_COMMUNITY): Payer: Self-pay

## 2020-10-05 MED ORDER — CLONAZEPAM 0.5 MG PO TABS
ORAL_TABLET | ORAL | 0 refills | Status: AC
  Filled 2020-10-05 – 2020-10-08 (×3): qty 90, 90d supply, fill #0

## 2020-10-06 ENCOUNTER — Other Ambulatory Visit (HOSPITAL_COMMUNITY): Payer: Self-pay

## 2020-10-08 ENCOUNTER — Other Ambulatory Visit (HOSPITAL_COMMUNITY): Payer: Self-pay

## 2020-11-15 ENCOUNTER — Other Ambulatory Visit (HOSPITAL_COMMUNITY): Payer: Self-pay

## 2020-11-16 ENCOUNTER — Other Ambulatory Visit (HOSPITAL_COMMUNITY): Payer: Self-pay

## 2020-11-16 MED ORDER — TESTOSTERONE CYPIONATE 200 MG/ML IM SOLN
INTRAMUSCULAR | 1 refills | Status: AC
  Filled 2020-11-16: qty 4, 28d supply, fill #0
  Filled 2021-03-08 (×2): qty 2, 14d supply, fill #0

## 2020-11-17 ENCOUNTER — Other Ambulatory Visit (HOSPITAL_COMMUNITY): Payer: Self-pay

## 2020-11-18 ENCOUNTER — Other Ambulatory Visit (HOSPITAL_COMMUNITY): Payer: Self-pay

## 2020-11-18 MED ORDER — FLUOXETINE HCL 40 MG PO CAPS
40.0000 mg | ORAL_CAPSULE | Freq: Every day | ORAL | 3 refills | Status: DC
  Filled 2020-11-18: qty 90, 90d supply, fill #0
  Filled 2021-02-08: qty 90, 90d supply, fill #1
  Filled 2021-02-21: qty 90, 90d supply, fill #0
  Filled 2021-08-09: qty 30, 30d supply, fill #1
  Filled 2021-09-11: qty 30, 30d supply, fill #2
  Filled 2021-10-10: qty 30, 30d supply, fill #3

## 2021-01-02 ENCOUNTER — Other Ambulatory Visit (HOSPITAL_COMMUNITY): Payer: Self-pay

## 2021-01-03 ENCOUNTER — Other Ambulatory Visit (HOSPITAL_COMMUNITY): Payer: Self-pay

## 2021-01-03 MED ORDER — CLONAZEPAM 0.5 MG PO TABS
0.5000 mg | ORAL_TABLET | Freq: Every evening | ORAL | 0 refills | Status: DC
  Filled 2021-01-04: qty 90, 90d supply, fill #0

## 2021-01-04 ENCOUNTER — Other Ambulatory Visit (HOSPITAL_COMMUNITY): Payer: Self-pay

## 2021-02-09 ENCOUNTER — Other Ambulatory Visit (HOSPITAL_COMMUNITY): Payer: Self-pay

## 2021-02-10 ENCOUNTER — Other Ambulatory Visit (HOSPITAL_COMMUNITY): Payer: Self-pay

## 2021-02-21 ENCOUNTER — Other Ambulatory Visit (HOSPITAL_BASED_OUTPATIENT_CLINIC_OR_DEPARTMENT_OTHER): Payer: Self-pay

## 2021-02-21 ENCOUNTER — Other Ambulatory Visit (HOSPITAL_COMMUNITY): Payer: Self-pay

## 2021-02-22 ENCOUNTER — Other Ambulatory Visit (HOSPITAL_BASED_OUTPATIENT_CLINIC_OR_DEPARTMENT_OTHER): Payer: Self-pay

## 2021-02-22 MED ORDER — CLONAZEPAM 0.5 MG PO TABS
ORAL_TABLET | ORAL | 0 refills | Status: AC
  Filled 2021-04-06: qty 30, 30d supply, fill #0
  Filled 2021-05-10: qty 30, 30d supply, fill #1
  Filled 2021-06-09: qty 30, 30d supply, fill #2

## 2021-03-08 ENCOUNTER — Other Ambulatory Visit (HOSPITAL_BASED_OUTPATIENT_CLINIC_OR_DEPARTMENT_OTHER): Payer: Self-pay

## 2021-04-06 ENCOUNTER — Other Ambulatory Visit (HOSPITAL_BASED_OUTPATIENT_CLINIC_OR_DEPARTMENT_OTHER): Payer: Self-pay

## 2021-04-06 ENCOUNTER — Other Ambulatory Visit (HOSPITAL_COMMUNITY): Payer: Self-pay

## 2021-04-06 MED ORDER — COLCHICINE 0.6 MG PO CAPS
1.0000 | ORAL_CAPSULE | Freq: Every day | ORAL | 1 refills | Status: DC | PRN
  Filled 2021-04-06 – 2021-04-08 (×2): qty 30, 30d supply, fill #0
  Filled 2022-03-02 – 2022-03-09 (×4): qty 30, 30d supply, fill #1

## 2021-04-06 MED ORDER — INDOMETHACIN 50 MG PO CAPS
ORAL_CAPSULE | ORAL | 0 refills | Status: DC
  Filled 2021-04-06 – 2021-04-08 (×2): qty 60, 30d supply, fill #0

## 2021-04-08 ENCOUNTER — Other Ambulatory Visit (HOSPITAL_BASED_OUTPATIENT_CLINIC_OR_DEPARTMENT_OTHER): Payer: Self-pay

## 2021-04-08 ENCOUNTER — Other Ambulatory Visit (HOSPITAL_COMMUNITY): Payer: Self-pay

## 2021-04-11 ENCOUNTER — Other Ambulatory Visit (HOSPITAL_BASED_OUTPATIENT_CLINIC_OR_DEPARTMENT_OTHER): Payer: Self-pay

## 2021-05-11 ENCOUNTER — Other Ambulatory Visit (HOSPITAL_BASED_OUTPATIENT_CLINIC_OR_DEPARTMENT_OTHER): Payer: Self-pay

## 2021-05-12 ENCOUNTER — Other Ambulatory Visit (HOSPITAL_BASED_OUTPATIENT_CLINIC_OR_DEPARTMENT_OTHER): Payer: Self-pay

## 2021-05-13 ENCOUNTER — Other Ambulatory Visit (HOSPITAL_BASED_OUTPATIENT_CLINIC_OR_DEPARTMENT_OTHER): Payer: Self-pay

## 2021-05-13 MED ORDER — PEG-3350/ELECTROLYTES 236 G PO SOLR
ORAL | 0 refills | Status: AC
  Filled 2021-05-13: qty 4000, 1d supply, fill #0

## 2021-05-17 ENCOUNTER — Other Ambulatory Visit (HOSPITAL_BASED_OUTPATIENT_CLINIC_OR_DEPARTMENT_OTHER): Payer: Self-pay

## 2021-05-17 MED ORDER — MUPIROCIN 2 % EX OINT
TOPICAL_OINTMENT | CUTANEOUS | 0 refills | Status: AC
  Filled 2021-05-17: qty 22, 7d supply, fill #0

## 2021-06-10 ENCOUNTER — Other Ambulatory Visit (HOSPITAL_BASED_OUTPATIENT_CLINIC_OR_DEPARTMENT_OTHER): Payer: Self-pay

## 2021-07-07 ENCOUNTER — Other Ambulatory Visit (HOSPITAL_BASED_OUTPATIENT_CLINIC_OR_DEPARTMENT_OTHER): Payer: Self-pay

## 2021-07-07 MED ORDER — CLONAZEPAM 0.5 MG PO TABS
ORAL_TABLET | ORAL | 0 refills | Status: DC
  Filled 2021-07-07: qty 30, 30d supply, fill #0
  Filled 2021-08-09: qty 30, 30d supply, fill #1
  Filled 2021-09-06: qty 30, 30d supply, fill #2

## 2021-07-08 ENCOUNTER — Other Ambulatory Visit (HOSPITAL_BASED_OUTPATIENT_CLINIC_OR_DEPARTMENT_OTHER): Payer: Self-pay

## 2021-08-09 ENCOUNTER — Other Ambulatory Visit (HOSPITAL_BASED_OUTPATIENT_CLINIC_OR_DEPARTMENT_OTHER): Payer: Self-pay

## 2021-08-22 ENCOUNTER — Other Ambulatory Visit (HOSPITAL_BASED_OUTPATIENT_CLINIC_OR_DEPARTMENT_OTHER): Payer: Self-pay

## 2021-08-22 MED ORDER — OMEPRAZOLE 20 MG PO CPDR
DELAYED_RELEASE_CAPSULE | ORAL | 0 refills | Status: DC
  Filled 2021-08-22: qty 30, 30d supply, fill #0

## 2021-09-06 ENCOUNTER — Other Ambulatory Visit: Payer: Self-pay | Admitting: Internal Medicine

## 2021-09-06 DIAGNOSIS — Z122 Encounter for screening for malignant neoplasm of respiratory organs: Secondary | ICD-10-CM

## 2021-09-07 ENCOUNTER — Other Ambulatory Visit (HOSPITAL_BASED_OUTPATIENT_CLINIC_OR_DEPARTMENT_OTHER): Payer: Self-pay

## 2021-09-12 ENCOUNTER — Other Ambulatory Visit (HOSPITAL_BASED_OUTPATIENT_CLINIC_OR_DEPARTMENT_OTHER): Payer: Self-pay

## 2021-09-21 ENCOUNTER — Ambulatory Visit
Admission: RE | Admit: 2021-09-21 | Discharge: 2021-09-21 | Disposition: A | Discharge status: Home / Self Care | Payer: Commercial Managed Care - HMO | Source: Ambulatory Visit | Attending: Internal Medicine | Admitting: Internal Medicine

## 2021-09-21 DIAGNOSIS — Z122 Encounter for screening for malignant neoplasm of respiratory organs: Secondary | ICD-10-CM

## 2021-09-26 ENCOUNTER — Other Ambulatory Visit (HOSPITAL_BASED_OUTPATIENT_CLINIC_OR_DEPARTMENT_OTHER): Payer: Self-pay

## 2021-09-26 MED ORDER — OMEPRAZOLE 20 MG PO CPDR
DELAYED_RELEASE_CAPSULE | ORAL | 0 refills | Status: AC
  Filled 2021-09-26: qty 30, 30d supply, fill #0

## 2021-10-10 ENCOUNTER — Other Ambulatory Visit (HOSPITAL_BASED_OUTPATIENT_CLINIC_OR_DEPARTMENT_OTHER): Payer: Self-pay

## 2021-10-10 MED ORDER — CLONAZEPAM 0.5 MG PO TABS
0.5000 mg | ORAL_TABLET | Freq: Every day | ORAL | 0 refills | Status: DC
  Filled 2021-10-10: qty 30, 30d supply, fill #0

## 2021-11-08 ENCOUNTER — Other Ambulatory Visit (HOSPITAL_BASED_OUTPATIENT_CLINIC_OR_DEPARTMENT_OTHER): Payer: Self-pay

## 2021-11-10 ENCOUNTER — Other Ambulatory Visit (HOSPITAL_BASED_OUTPATIENT_CLINIC_OR_DEPARTMENT_OTHER): Payer: Self-pay

## 2021-11-11 ENCOUNTER — Other Ambulatory Visit (HOSPITAL_BASED_OUTPATIENT_CLINIC_OR_DEPARTMENT_OTHER): Payer: Self-pay

## 2021-11-11 MED ORDER — CLONAZEPAM 0.5 MG PO TABS
0.5000 mg | ORAL_TABLET | Freq: Every evening | ORAL | 0 refills | Status: DC
  Filled 2021-11-11: qty 30, 30d supply, fill #0

## 2021-11-11 MED ORDER — FLUOXETINE HCL 40 MG PO CAPS
40.0000 mg | ORAL_CAPSULE | Freq: Every day | ORAL | 3 refills | Status: DC
  Filled 2021-11-11: qty 30, 30d supply, fill #0
  Filled 2021-12-11: qty 30, 30d supply, fill #1
  Filled 2022-01-10: qty 30, 30d supply, fill #2
  Filled 2022-02-08: qty 30, 30d supply, fill #3
  Filled 2022-03-11: qty 30, 30d supply, fill #4
  Filled 2022-04-07: qty 30, 30d supply, fill #5
  Filled 2022-05-08: qty 30, 30d supply, fill #6
  Filled 2022-06-06: qty 30, 30d supply, fill #7
  Filled 2022-07-11: qty 30, 30d supply, fill #8
  Filled 2022-08-02: qty 30, 30d supply, fill #9
  Filled 2022-09-04: qty 30, 30d supply, fill #10
  Filled 2022-10-09: qty 30, 30d supply, fill #11

## 2021-12-06 ENCOUNTER — Other Ambulatory Visit (HOSPITAL_BASED_OUTPATIENT_CLINIC_OR_DEPARTMENT_OTHER): Payer: Self-pay

## 2021-12-06 MED ORDER — CLONAZEPAM 0.5 MG PO TABS
0.5000 mg | ORAL_TABLET | Freq: Every day | ORAL | 0 refills | Status: DC
  Filled 2021-12-09: qty 30, 30d supply, fill #0

## 2021-12-08 ENCOUNTER — Other Ambulatory Visit (HOSPITAL_BASED_OUTPATIENT_CLINIC_OR_DEPARTMENT_OTHER): Payer: Self-pay

## 2021-12-09 ENCOUNTER — Other Ambulatory Visit (HOSPITAL_BASED_OUTPATIENT_CLINIC_OR_DEPARTMENT_OTHER): Payer: Self-pay

## 2021-12-12 ENCOUNTER — Other Ambulatory Visit (HOSPITAL_BASED_OUTPATIENT_CLINIC_OR_DEPARTMENT_OTHER): Payer: Self-pay

## 2022-01-03 ENCOUNTER — Other Ambulatory Visit (HOSPITAL_BASED_OUTPATIENT_CLINIC_OR_DEPARTMENT_OTHER): Payer: Self-pay

## 2022-01-03 MED ORDER — CLONAZEPAM 0.5 MG PO TABS
0.5000 mg | ORAL_TABLET | Freq: Every day | ORAL | 0 refills | Status: DC
  Filled 2022-01-07: qty 30, 30d supply, fill #0

## 2022-01-07 ENCOUNTER — Other Ambulatory Visit (HOSPITAL_BASED_OUTPATIENT_CLINIC_OR_DEPARTMENT_OTHER): Payer: Self-pay

## 2022-01-11 ENCOUNTER — Other Ambulatory Visit (HOSPITAL_BASED_OUTPATIENT_CLINIC_OR_DEPARTMENT_OTHER): Payer: Self-pay

## 2022-01-25 ENCOUNTER — Other Ambulatory Visit (HOSPITAL_BASED_OUTPATIENT_CLINIC_OR_DEPARTMENT_OTHER): Payer: Self-pay

## 2022-01-25 MED ORDER — ROSUVASTATIN CALCIUM 10 MG PO TABS
10.0000 mg | ORAL_TABLET | Freq: Every day | ORAL | 0 refills | Status: DC
  Filled 2022-01-25: qty 30, 30d supply, fill #0

## 2022-01-26 ENCOUNTER — Other Ambulatory Visit (HOSPITAL_BASED_OUTPATIENT_CLINIC_OR_DEPARTMENT_OTHER): Payer: Self-pay

## 2022-01-30 ENCOUNTER — Other Ambulatory Visit (HOSPITAL_BASED_OUTPATIENT_CLINIC_OR_DEPARTMENT_OTHER): Payer: Self-pay

## 2022-01-31 ENCOUNTER — Other Ambulatory Visit (HOSPITAL_BASED_OUTPATIENT_CLINIC_OR_DEPARTMENT_OTHER): Payer: Self-pay

## 2022-01-31 MED ORDER — TESTOSTERONE CYPIONATE 200 MG/ML IM SOLN
INTRAMUSCULAR | 1 refills | Status: AC
  Filled 2022-01-31: qty 2, 28d supply, fill #0

## 2022-02-01 ENCOUNTER — Other Ambulatory Visit (HOSPITAL_BASED_OUTPATIENT_CLINIC_OR_DEPARTMENT_OTHER): Payer: Self-pay

## 2022-02-01 MED ORDER — CLONAZEPAM 0.5 MG PO TABS
0.5000 mg | ORAL_TABLET | Freq: Every evening | ORAL | 0 refills | Status: DC
  Filled 2022-02-03: qty 30, 30d supply, fill #0

## 2022-02-02 ENCOUNTER — Other Ambulatory Visit (HOSPITAL_BASED_OUTPATIENT_CLINIC_OR_DEPARTMENT_OTHER): Payer: Self-pay

## 2022-02-03 ENCOUNTER — Other Ambulatory Visit: Payer: Self-pay

## 2022-02-03 ENCOUNTER — Other Ambulatory Visit (HOSPITAL_BASED_OUTPATIENT_CLINIC_OR_DEPARTMENT_OTHER): Payer: Self-pay

## 2022-02-21 ENCOUNTER — Other Ambulatory Visit (HOSPITAL_BASED_OUTPATIENT_CLINIC_OR_DEPARTMENT_OTHER): Payer: Self-pay

## 2022-02-21 MED ORDER — ROSUVASTATIN CALCIUM 10 MG PO TABS
10.0000 mg | ORAL_TABLET | Freq: Every day | ORAL | 0 refills | Status: DC
  Filled 2022-02-21: qty 30, 30d supply, fill #0

## 2022-03-02 ENCOUNTER — Other Ambulatory Visit (HOSPITAL_BASED_OUTPATIENT_CLINIC_OR_DEPARTMENT_OTHER): Payer: Self-pay

## 2022-03-02 MED ORDER — INDOMETHACIN 50 MG PO CAPS
50.0000 mg | ORAL_CAPSULE | Freq: Two times a day (BID) | ORAL | 0 refills | Status: AC
  Filled 2022-03-02: qty 60, 30d supply, fill #0

## 2022-03-03 ENCOUNTER — Other Ambulatory Visit (HOSPITAL_BASED_OUTPATIENT_CLINIC_OR_DEPARTMENT_OTHER): Payer: Self-pay

## 2022-03-07 ENCOUNTER — Other Ambulatory Visit (HOSPITAL_BASED_OUTPATIENT_CLINIC_OR_DEPARTMENT_OTHER): Payer: Self-pay

## 2022-03-08 ENCOUNTER — Other Ambulatory Visit (HOSPITAL_BASED_OUTPATIENT_CLINIC_OR_DEPARTMENT_OTHER): Payer: Self-pay

## 2022-03-09 ENCOUNTER — Other Ambulatory Visit (HOSPITAL_BASED_OUTPATIENT_CLINIC_OR_DEPARTMENT_OTHER): Payer: Self-pay

## 2022-03-09 MED ORDER — CLONAZEPAM 0.5 MG PO TABS
0.5000 mg | ORAL_TABLET | Freq: Every evening | ORAL | 0 refills | Status: DC
  Filled 2022-03-09: qty 30, 30d supply, fill #0

## 2022-03-14 ENCOUNTER — Other Ambulatory Visit (HOSPITAL_BASED_OUTPATIENT_CLINIC_OR_DEPARTMENT_OTHER): Payer: Self-pay

## 2022-03-15 ENCOUNTER — Other Ambulatory Visit (HOSPITAL_BASED_OUTPATIENT_CLINIC_OR_DEPARTMENT_OTHER): Payer: Self-pay

## 2022-03-15 MED ORDER — COLCHICINE 0.6 MG PO TABS
0.6000 mg | ORAL_TABLET | Freq: Every day | ORAL | 0 refills | Status: DC | PRN
  Filled 2022-03-15: qty 30, 30d supply, fill #0

## 2022-03-16 ENCOUNTER — Other Ambulatory Visit (HOSPITAL_BASED_OUTPATIENT_CLINIC_OR_DEPARTMENT_OTHER): Payer: Self-pay

## 2022-03-20 ENCOUNTER — Other Ambulatory Visit (HOSPITAL_BASED_OUTPATIENT_CLINIC_OR_DEPARTMENT_OTHER): Payer: Self-pay

## 2022-03-20 MED ORDER — CYCLOBENZAPRINE HCL 5 MG PO TABS
5.0000 mg | ORAL_TABLET | Freq: Every evening | ORAL | 0 refills | Status: AC | PRN
  Filled 2022-03-20: qty 10, 10d supply, fill #0

## 2022-03-23 ENCOUNTER — Other Ambulatory Visit (HOSPITAL_BASED_OUTPATIENT_CLINIC_OR_DEPARTMENT_OTHER): Payer: Self-pay

## 2022-03-24 ENCOUNTER — Other Ambulatory Visit (HOSPITAL_BASED_OUTPATIENT_CLINIC_OR_DEPARTMENT_OTHER): Payer: Self-pay

## 2022-03-25 ENCOUNTER — Other Ambulatory Visit (HOSPITAL_BASED_OUTPATIENT_CLINIC_OR_DEPARTMENT_OTHER): Payer: Self-pay

## 2022-03-27 ENCOUNTER — Other Ambulatory Visit (HOSPITAL_BASED_OUTPATIENT_CLINIC_OR_DEPARTMENT_OTHER): Payer: Self-pay

## 2022-03-27 MED ORDER — BETAMETHASONE DIPROPIONATE AUG 0.05 % EX CREA
TOPICAL_CREAM | CUTANEOUS | 0 refills | Status: AC
  Filled 2022-03-27: qty 50, 30d supply, fill #0

## 2022-03-27 MED ORDER — INDOMETHACIN 50 MG PO CAPS
50.0000 mg | ORAL_CAPSULE | Freq: Two times a day (BID) | ORAL | 3 refills | Status: AC | PRN
  Filled 2022-03-27: qty 60, 30d supply, fill #0

## 2022-03-27 MED ORDER — ROSUVASTATIN CALCIUM 10 MG PO TABS
10.0000 mg | ORAL_TABLET | Freq: Every day | ORAL | 3 refills | Status: DC
  Filled 2022-03-27: qty 30, 30d supply, fill #0
  Filled 2022-04-25: qty 30, 30d supply, fill #1
  Filled 2022-05-23: qty 30, 30d supply, fill #2
  Filled 2022-06-20: qty 30, 30d supply, fill #3
  Filled 2022-07-24: qty 30, 30d supply, fill #4
  Filled 2022-08-22: qty 30, 30d supply, fill #5
  Filled 2022-09-21: qty 30, 30d supply, fill #6
  Filled 2022-10-20: qty 30, 30d supply, fill #7
  Filled 2022-11-20: qty 30, 30d supply, fill #8
  Filled 2022-12-19: qty 30, 30d supply, fill #9
  Filled 2023-01-22: qty 30, 30d supply, fill #10
  Filled 2023-02-21: qty 30, 30d supply, fill #11

## 2022-03-28 ENCOUNTER — Other Ambulatory Visit (HOSPITAL_BASED_OUTPATIENT_CLINIC_OR_DEPARTMENT_OTHER): Payer: Self-pay

## 2022-04-07 ENCOUNTER — Other Ambulatory Visit (HOSPITAL_BASED_OUTPATIENT_CLINIC_OR_DEPARTMENT_OTHER): Payer: Self-pay

## 2022-04-08 ENCOUNTER — Other Ambulatory Visit (HOSPITAL_BASED_OUTPATIENT_CLINIC_OR_DEPARTMENT_OTHER): Payer: Self-pay

## 2022-04-08 MED ORDER — CLONAZEPAM 0.5 MG PO TABS
0.5000 mg | ORAL_TABLET | Freq: Every day | ORAL | 0 refills | Status: DC
  Filled 2022-04-08: qty 30, 30d supply, fill #0

## 2022-05-08 ENCOUNTER — Other Ambulatory Visit (HOSPITAL_BASED_OUTPATIENT_CLINIC_OR_DEPARTMENT_OTHER): Payer: Self-pay

## 2022-05-09 ENCOUNTER — Other Ambulatory Visit (HOSPITAL_BASED_OUTPATIENT_CLINIC_OR_DEPARTMENT_OTHER): Payer: Self-pay

## 2022-05-09 MED ORDER — CLONAZEPAM 0.5 MG PO TABS
0.5000 mg | ORAL_TABLET | Freq: Every day | ORAL | 0 refills | Status: AC
  Filled 2022-05-09: qty 30, 30d supply, fill #0

## 2022-05-23 ENCOUNTER — Other Ambulatory Visit (HOSPITAL_BASED_OUTPATIENT_CLINIC_OR_DEPARTMENT_OTHER): Payer: Self-pay

## 2022-06-06 ENCOUNTER — Other Ambulatory Visit (HOSPITAL_BASED_OUTPATIENT_CLINIC_OR_DEPARTMENT_OTHER): Payer: Self-pay

## 2022-06-06 MED ORDER — CLONAZEPAM 0.5 MG PO TABS
0.5000 mg | ORAL_TABLET | Freq: Every evening | ORAL | 0 refills | Status: DC
  Filled 2022-06-06: qty 30, 30d supply, fill #0

## 2022-06-07 ENCOUNTER — Other Ambulatory Visit (HOSPITAL_BASED_OUTPATIENT_CLINIC_OR_DEPARTMENT_OTHER): Payer: Self-pay

## 2022-06-08 ENCOUNTER — Other Ambulatory Visit (HOSPITAL_BASED_OUTPATIENT_CLINIC_OR_DEPARTMENT_OTHER): Payer: Self-pay

## 2022-07-10 ENCOUNTER — Other Ambulatory Visit (HOSPITAL_BASED_OUTPATIENT_CLINIC_OR_DEPARTMENT_OTHER): Payer: Self-pay

## 2022-07-11 ENCOUNTER — Other Ambulatory Visit: Payer: Self-pay

## 2022-07-12 ENCOUNTER — Other Ambulatory Visit (HOSPITAL_BASED_OUTPATIENT_CLINIC_OR_DEPARTMENT_OTHER): Payer: Self-pay

## 2022-07-12 MED ORDER — CLONAZEPAM 0.5 MG PO TABS
0.5000 mg | ORAL_TABLET | Freq: Every day | ORAL | 0 refills | Status: DC
  Filled 2022-07-12: qty 30, 30d supply, fill #0

## 2022-07-13 ENCOUNTER — Other Ambulatory Visit: Payer: Self-pay

## 2022-08-02 ENCOUNTER — Other Ambulatory Visit (HOSPITAL_BASED_OUTPATIENT_CLINIC_OR_DEPARTMENT_OTHER): Payer: Self-pay

## 2022-08-03 ENCOUNTER — Other Ambulatory Visit (HOSPITAL_BASED_OUTPATIENT_CLINIC_OR_DEPARTMENT_OTHER): Payer: Self-pay

## 2022-08-07 ENCOUNTER — Other Ambulatory Visit (HOSPITAL_BASED_OUTPATIENT_CLINIC_OR_DEPARTMENT_OTHER): Payer: Self-pay

## 2022-08-09 ENCOUNTER — Other Ambulatory Visit (HOSPITAL_BASED_OUTPATIENT_CLINIC_OR_DEPARTMENT_OTHER): Payer: Self-pay

## 2022-08-10 ENCOUNTER — Other Ambulatory Visit: Payer: Self-pay

## 2022-08-10 ENCOUNTER — Other Ambulatory Visit (HOSPITAL_BASED_OUTPATIENT_CLINIC_OR_DEPARTMENT_OTHER): Payer: Self-pay

## 2022-08-10 MED ORDER — CLONAZEPAM 0.5 MG PO TABS
0.5000 mg | ORAL_TABLET | Freq: Every day | ORAL | 0 refills | Status: DC
  Filled 2022-08-10: qty 30, 30d supply, fill #0

## 2022-08-11 ENCOUNTER — Other Ambulatory Visit (HOSPITAL_BASED_OUTPATIENT_CLINIC_OR_DEPARTMENT_OTHER): Payer: Self-pay

## 2022-08-24 ENCOUNTER — Other Ambulatory Visit (HOSPITAL_BASED_OUTPATIENT_CLINIC_OR_DEPARTMENT_OTHER): Payer: Self-pay

## 2022-09-04 ENCOUNTER — Other Ambulatory Visit (HOSPITAL_BASED_OUTPATIENT_CLINIC_OR_DEPARTMENT_OTHER): Payer: Self-pay

## 2022-09-05 ENCOUNTER — Other Ambulatory Visit (HOSPITAL_BASED_OUTPATIENT_CLINIC_OR_DEPARTMENT_OTHER): Payer: Self-pay

## 2022-09-05 MED ORDER — CLONAZEPAM 0.5 MG PO TABS
0.5000 mg | ORAL_TABLET | Freq: Every day | ORAL | 0 refills | Status: DC
  Filled 2022-09-05 – 2022-09-08 (×2): qty 30, 30d supply, fill #0

## 2022-09-06 ENCOUNTER — Other Ambulatory Visit (HOSPITAL_BASED_OUTPATIENT_CLINIC_OR_DEPARTMENT_OTHER): Payer: Self-pay

## 2022-09-08 ENCOUNTER — Other Ambulatory Visit (HOSPITAL_BASED_OUTPATIENT_CLINIC_OR_DEPARTMENT_OTHER): Payer: Self-pay

## 2022-10-09 ENCOUNTER — Other Ambulatory Visit (HOSPITAL_BASED_OUTPATIENT_CLINIC_OR_DEPARTMENT_OTHER): Payer: Self-pay

## 2022-10-09 MED ORDER — CLONAZEPAM 0.5 MG PO TABS
0.5000 mg | ORAL_TABLET | Freq: Every day | ORAL | 0 refills | Status: DC
  Filled 2022-10-09: qty 30, 30d supply, fill #0

## 2022-10-11 ENCOUNTER — Other Ambulatory Visit (HOSPITAL_BASED_OUTPATIENT_CLINIC_OR_DEPARTMENT_OTHER): Payer: Self-pay

## 2022-10-23 ENCOUNTER — Other Ambulatory Visit (HOSPITAL_BASED_OUTPATIENT_CLINIC_OR_DEPARTMENT_OTHER): Payer: Self-pay

## 2022-11-05 ENCOUNTER — Other Ambulatory Visit (HOSPITAL_BASED_OUTPATIENT_CLINIC_OR_DEPARTMENT_OTHER): Payer: Self-pay

## 2022-11-06 ENCOUNTER — Other Ambulatory Visit (HOSPITAL_BASED_OUTPATIENT_CLINIC_OR_DEPARTMENT_OTHER): Payer: Self-pay

## 2022-11-06 MED ORDER — CLONAZEPAM 0.5 MG PO TABS
0.5000 mg | ORAL_TABLET | Freq: Every day | ORAL | 0 refills | Status: DC
  Filled 2022-11-06: qty 30, 30d supply, fill #0

## 2022-11-07 ENCOUNTER — Other Ambulatory Visit (HOSPITAL_BASED_OUTPATIENT_CLINIC_OR_DEPARTMENT_OTHER): Payer: Self-pay

## 2022-11-07 MED ORDER — FLUOXETINE HCL 40 MG PO CAPS
40.0000 mg | ORAL_CAPSULE | Freq: Every day | ORAL | 3 refills | Status: DC
  Filled 2022-11-07: qty 30, 30d supply, fill #0
  Filled 2022-12-05: qty 30, 30d supply, fill #1
  Filled 2023-01-02: qty 30, 30d supply, fill #2
  Filled 2023-02-02: qty 30, 30d supply, fill #3
  Filled 2023-03-05: qty 30, 30d supply, fill #4
  Filled 2023-04-03: qty 30, 30d supply, fill #5
  Filled 2023-05-07: qty 30, 30d supply, fill #6
  Filled 2023-06-05: qty 30, 30d supply, fill #7
  Filled 2023-07-03: qty 30, 30d supply, fill #8
  Filled 2023-08-07: qty 30, 30d supply, fill #9
  Filled 2023-09-03: qty 30, 30d supply, fill #10
  Filled 2023-10-02: qty 30, 30d supply, fill #11

## 2022-11-08 ENCOUNTER — Other Ambulatory Visit (HOSPITAL_BASED_OUTPATIENT_CLINIC_OR_DEPARTMENT_OTHER): Payer: Self-pay

## 2022-11-20 ENCOUNTER — Other Ambulatory Visit (HOSPITAL_BASED_OUTPATIENT_CLINIC_OR_DEPARTMENT_OTHER): Payer: Self-pay

## 2022-11-21 ENCOUNTER — Other Ambulatory Visit (HOSPITAL_BASED_OUTPATIENT_CLINIC_OR_DEPARTMENT_OTHER): Payer: Self-pay

## 2022-11-21 MED ORDER — COLCHICINE 0.6 MG PO TABS
0.6000 mg | ORAL_TABLET | Freq: Every day | ORAL | 0 refills | Status: DC
  Filled 2022-11-21: qty 30, 30d supply, fill #0

## 2022-11-21 MED ORDER — TACROLIMUS 0.1 % EX OINT
TOPICAL_OINTMENT | Freq: Every day | CUTANEOUS | 11 refills | Status: AC
  Filled 2022-11-21: qty 30, 30d supply, fill #0

## 2022-12-05 ENCOUNTER — Other Ambulatory Visit (HOSPITAL_BASED_OUTPATIENT_CLINIC_OR_DEPARTMENT_OTHER): Payer: Self-pay

## 2022-12-06 ENCOUNTER — Other Ambulatory Visit (HOSPITAL_BASED_OUTPATIENT_CLINIC_OR_DEPARTMENT_OTHER): Payer: Self-pay

## 2022-12-06 MED ORDER — CLONAZEPAM 0.5 MG PO TABS
0.5000 mg | ORAL_TABLET | Freq: Every day | ORAL | 0 refills | Status: DC
  Filled 2022-12-06: qty 30, 30d supply, fill #0

## 2022-12-07 ENCOUNTER — Other Ambulatory Visit (HOSPITAL_BASED_OUTPATIENT_CLINIC_OR_DEPARTMENT_OTHER): Payer: Self-pay

## 2022-12-08 ENCOUNTER — Other Ambulatory Visit (HOSPITAL_BASED_OUTPATIENT_CLINIC_OR_DEPARTMENT_OTHER): Payer: Self-pay

## 2022-12-08 MED ORDER — PREDNISONE 10 MG PO TABS
ORAL_TABLET | ORAL | 1 refills | Status: AC
  Filled 2022-12-08: qty 24, 9d supply, fill #0

## 2023-01-02 ENCOUNTER — Other Ambulatory Visit (HOSPITAL_BASED_OUTPATIENT_CLINIC_OR_DEPARTMENT_OTHER): Payer: Self-pay

## 2023-01-04 ENCOUNTER — Other Ambulatory Visit (HOSPITAL_BASED_OUTPATIENT_CLINIC_OR_DEPARTMENT_OTHER): Payer: Self-pay

## 2023-01-04 MED ORDER — CLONAZEPAM 0.5 MG PO TABS
0.5000 mg | ORAL_TABLET | Freq: Every day | ORAL | 0 refills | Status: DC
  Filled 2023-01-04: qty 30, 30d supply, fill #0

## 2023-01-05 ENCOUNTER — Other Ambulatory Visit (HOSPITAL_BASED_OUTPATIENT_CLINIC_OR_DEPARTMENT_OTHER): Payer: Self-pay

## 2023-01-29 ENCOUNTER — Other Ambulatory Visit: Payer: Self-pay | Admitting: Internal Medicine

## 2023-01-29 DIAGNOSIS — Z87891 Personal history of nicotine dependence: Secondary | ICD-10-CM

## 2023-02-02 ENCOUNTER — Other Ambulatory Visit (HOSPITAL_BASED_OUTPATIENT_CLINIC_OR_DEPARTMENT_OTHER): Payer: Self-pay

## 2023-02-04 ENCOUNTER — Other Ambulatory Visit (HOSPITAL_BASED_OUTPATIENT_CLINIC_OR_DEPARTMENT_OTHER): Payer: Self-pay

## 2023-02-04 MED ORDER — CLONAZEPAM 0.5 MG PO TABS
0.5000 mg | ORAL_TABLET | Freq: Every day | ORAL | 0 refills | Status: DC
  Filled 2023-02-04: qty 30, 30d supply, fill #0

## 2023-02-05 ENCOUNTER — Other Ambulatory Visit (HOSPITAL_BASED_OUTPATIENT_CLINIC_OR_DEPARTMENT_OTHER): Payer: Self-pay

## 2023-02-20 ENCOUNTER — Encounter: Payer: Self-pay | Admitting: Internal Medicine

## 2023-03-05 ENCOUNTER — Other Ambulatory Visit (HOSPITAL_BASED_OUTPATIENT_CLINIC_OR_DEPARTMENT_OTHER): Payer: Self-pay

## 2023-03-07 ENCOUNTER — Other Ambulatory Visit (HOSPITAL_BASED_OUTPATIENT_CLINIC_OR_DEPARTMENT_OTHER): Payer: Self-pay

## 2023-03-07 MED ORDER — CLONAZEPAM 0.5 MG PO TABS
0.5000 mg | ORAL_TABLET | Freq: Every day | ORAL | 0 refills | Status: AC
  Filled 2023-03-07: qty 30, 30d supply, fill #0

## 2023-03-08 ENCOUNTER — Ambulatory Visit
Admission: RE | Admit: 2023-03-08 | Discharge: 2023-03-08 | Disposition: A | Discharge status: Home / Self Care | Payer: Commercial Managed Care - HMO | Source: Ambulatory Visit | Attending: Internal Medicine | Admitting: Internal Medicine

## 2023-03-08 DIAGNOSIS — Z87891 Personal history of nicotine dependence: Secondary | ICD-10-CM

## 2023-03-12 ENCOUNTER — Other Ambulatory Visit: Payer: Self-pay

## 2023-03-20 ENCOUNTER — Other Ambulatory Visit (HOSPITAL_BASED_OUTPATIENT_CLINIC_OR_DEPARTMENT_OTHER): Payer: Self-pay

## 2023-03-20 MED ORDER — ROSUVASTATIN CALCIUM 10 MG PO TABS
10.0000 mg | ORAL_TABLET | Freq: Every day | ORAL | 3 refills | Status: DC
  Filled 2023-03-20: qty 90, 90d supply, fill #0
  Filled 2023-06-19: qty 90, 90d supply, fill #1
  Filled 2023-09-18: qty 90, 90d supply, fill #2
  Filled 2023-12-18: qty 90, 90d supply, fill #3

## 2023-03-23 ENCOUNTER — Other Ambulatory Visit (HOSPITAL_BASED_OUTPATIENT_CLINIC_OR_DEPARTMENT_OTHER): Payer: Self-pay

## 2023-03-28 ENCOUNTER — Other Ambulatory Visit (HOSPITAL_BASED_OUTPATIENT_CLINIC_OR_DEPARTMENT_OTHER): Payer: Self-pay | Admitting: Internal Medicine

## 2023-03-28 DIAGNOSIS — I2510 Atherosclerotic heart disease of native coronary artery without angina pectoris: Secondary | ICD-10-CM

## 2023-03-29 ENCOUNTER — Other Ambulatory Visit (HOSPITAL_BASED_OUTPATIENT_CLINIC_OR_DEPARTMENT_OTHER): Payer: Self-pay

## 2023-03-29 MED ORDER — COLCHICINE 0.6 MG PO TABS
0.6000 mg | ORAL_TABLET | Freq: Every day | ORAL | 0 refills | Status: DC | PRN
  Filled 2023-03-29: qty 30, 30d supply, fill #0

## 2023-04-03 ENCOUNTER — Other Ambulatory Visit (HOSPITAL_BASED_OUTPATIENT_CLINIC_OR_DEPARTMENT_OTHER): Payer: Self-pay

## 2023-04-03 MED ORDER — CLONAZEPAM 0.5 MG PO TABS
0.5000 mg | ORAL_TABLET | Freq: Every day | ORAL | 0 refills | Status: DC
  Filled 2023-04-04 – 2023-04-06 (×2): qty 30, 30d supply, fill #0

## 2023-04-04 ENCOUNTER — Other Ambulatory Visit (HOSPITAL_BASED_OUTPATIENT_CLINIC_OR_DEPARTMENT_OTHER): Payer: Self-pay

## 2023-04-06 ENCOUNTER — Other Ambulatory Visit (HOSPITAL_BASED_OUTPATIENT_CLINIC_OR_DEPARTMENT_OTHER): Payer: Self-pay

## 2023-04-24 ENCOUNTER — Ambulatory Visit (HOSPITAL_BASED_OUTPATIENT_CLINIC_OR_DEPARTMENT_OTHER)
Admission: RE | Admit: 2023-04-24 | Discharge: 2023-04-24 | Disposition: A | Discharge status: Home / Self Care | Payer: Self-pay | Source: Ambulatory Visit | Attending: Internal Medicine | Admitting: Internal Medicine

## 2023-04-24 DIAGNOSIS — I2510 Atherosclerotic heart disease of native coronary artery without angina pectoris: Secondary | ICD-10-CM | POA: Insufficient documentation

## 2023-04-26 ENCOUNTER — Other Ambulatory Visit (HOSPITAL_BASED_OUTPATIENT_CLINIC_OR_DEPARTMENT_OTHER): Payer: Self-pay

## 2023-04-26 MED ORDER — PREDNISONE 10 MG PO TABS
ORAL_TABLET | ORAL | 0 refills | Status: AC
  Filled 2023-04-26: qty 30, 12d supply, fill #0

## 2023-04-27 ENCOUNTER — Other Ambulatory Visit (HOSPITAL_COMMUNITY): Payer: Self-pay

## 2023-05-03 ENCOUNTER — Other Ambulatory Visit (HOSPITAL_BASED_OUTPATIENT_CLINIC_OR_DEPARTMENT_OTHER): Payer: Self-pay

## 2023-05-04 ENCOUNTER — Other Ambulatory Visit (HOSPITAL_BASED_OUTPATIENT_CLINIC_OR_DEPARTMENT_OTHER): Payer: Self-pay

## 2023-05-04 MED ORDER — CLONAZEPAM 0.5 MG PO TABS
0.5000 mg | ORAL_TABLET | Freq: Every day | ORAL | 0 refills | Status: DC
  Filled 2023-05-04: qty 13, 13d supply, fill #0
  Filled 2023-05-04: qty 17, 17d supply, fill #0

## 2023-05-07 ENCOUNTER — Other Ambulatory Visit (HOSPITAL_BASED_OUTPATIENT_CLINIC_OR_DEPARTMENT_OTHER): Payer: Self-pay

## 2023-06-05 ENCOUNTER — Other Ambulatory Visit (HOSPITAL_BASED_OUTPATIENT_CLINIC_OR_DEPARTMENT_OTHER): Payer: Self-pay

## 2023-06-06 ENCOUNTER — Other Ambulatory Visit (HOSPITAL_BASED_OUTPATIENT_CLINIC_OR_DEPARTMENT_OTHER): Payer: Self-pay

## 2023-06-06 MED ORDER — CLONAZEPAM 0.5 MG PO TABS
0.5000 mg | ORAL_TABLET | Freq: Every day | ORAL | 0 refills | Status: DC
Start: 2023-06-05 — End: 2023-07-04
  Filled 2023-06-06: qty 30, 30d supply, fill #0

## 2023-07-03 ENCOUNTER — Other Ambulatory Visit (HOSPITAL_BASED_OUTPATIENT_CLINIC_OR_DEPARTMENT_OTHER): Payer: Self-pay

## 2023-07-04 ENCOUNTER — Other Ambulatory Visit (HOSPITAL_BASED_OUTPATIENT_CLINIC_OR_DEPARTMENT_OTHER): Payer: Self-pay

## 2023-07-04 ENCOUNTER — Other Ambulatory Visit: Payer: Self-pay

## 2023-07-04 MED ORDER — CLONAZEPAM 0.5 MG PO TABS
0.5000 mg | ORAL_TABLET | Freq: Every day | ORAL | 0 refills | Status: DC
  Filled 2023-07-04 (×2): qty 30, 30d supply, fill #0

## 2023-07-04 MED ORDER — COLCHICINE 0.6 MG PO TABS
0.6000 mg | ORAL_TABLET | Freq: Every day | ORAL | 0 refills | Status: AC | PRN
  Filled 2023-07-04: qty 30, 30d supply, fill #0

## 2023-07-25 ENCOUNTER — Other Ambulatory Visit (HOSPITAL_BASED_OUTPATIENT_CLINIC_OR_DEPARTMENT_OTHER): Payer: Self-pay

## 2023-07-26 ENCOUNTER — Other Ambulatory Visit (HOSPITAL_BASED_OUTPATIENT_CLINIC_OR_DEPARTMENT_OTHER): Payer: Self-pay

## 2023-07-27 ENCOUNTER — Other Ambulatory Visit (HOSPITAL_BASED_OUTPATIENT_CLINIC_OR_DEPARTMENT_OTHER): Payer: Self-pay

## 2023-07-27 MED ORDER — COLCHICINE 0.6 MG PO TABS
0.6000 mg | ORAL_TABLET | Freq: Every day | ORAL | 0 refills | Status: DC
  Filled 2023-07-27: qty 30, 30d supply, fill #0

## 2023-07-31 ENCOUNTER — Other Ambulatory Visit (HOSPITAL_BASED_OUTPATIENT_CLINIC_OR_DEPARTMENT_OTHER): Payer: Self-pay

## 2023-08-06 ENCOUNTER — Other Ambulatory Visit (HOSPITAL_BASED_OUTPATIENT_CLINIC_OR_DEPARTMENT_OTHER): Payer: Self-pay

## 2023-08-06 MED ORDER — CLONAZEPAM 0.5 MG PO TABS
0.5000 mg | ORAL_TABLET | Freq: Every day | ORAL | 0 refills | Status: DC
  Filled 2023-08-06: qty 30, 30d supply, fill #0

## 2023-08-07 ENCOUNTER — Other Ambulatory Visit (HOSPITAL_COMMUNITY): Payer: Self-pay

## 2023-08-07 ENCOUNTER — Other Ambulatory Visit (HOSPITAL_BASED_OUTPATIENT_CLINIC_OR_DEPARTMENT_OTHER): Payer: Self-pay

## 2023-08-27 ENCOUNTER — Other Ambulatory Visit (HOSPITAL_BASED_OUTPATIENT_CLINIC_OR_DEPARTMENT_OTHER): Payer: Self-pay

## 2023-08-27 MED ORDER — COLCHICINE 0.6 MG PO TABS
0.6000 mg | ORAL_TABLET | Freq: Every day | ORAL | 0 refills | Status: DC | PRN
  Filled 2023-08-27: qty 30, 30d supply, fill #0

## 2023-09-01 ENCOUNTER — Other Ambulatory Visit (HOSPITAL_BASED_OUTPATIENT_CLINIC_OR_DEPARTMENT_OTHER): Payer: Self-pay

## 2023-09-03 ENCOUNTER — Other Ambulatory Visit (HOSPITAL_BASED_OUTPATIENT_CLINIC_OR_DEPARTMENT_OTHER): Payer: Self-pay

## 2023-09-04 ENCOUNTER — Other Ambulatory Visit (HOSPITAL_BASED_OUTPATIENT_CLINIC_OR_DEPARTMENT_OTHER): Payer: Self-pay

## 2023-09-04 MED ORDER — CLONAZEPAM 0.5 MG PO TABS
0.5000 mg | ORAL_TABLET | Freq: Every day | ORAL | 0 refills | Status: DC
  Filled 2023-09-04: qty 30, 30d supply, fill #0

## 2023-09-05 ENCOUNTER — Other Ambulatory Visit (HOSPITAL_BASED_OUTPATIENT_CLINIC_OR_DEPARTMENT_OTHER): Payer: Self-pay

## 2023-10-02 ENCOUNTER — Other Ambulatory Visit (HOSPITAL_BASED_OUTPATIENT_CLINIC_OR_DEPARTMENT_OTHER): Payer: Self-pay

## 2023-10-02 MED ORDER — COLCHICINE 0.6 MG PO TABS
0.6000 mg | ORAL_TABLET | Freq: Every day | ORAL | 0 refills | Status: DC | PRN
  Filled 2023-10-02: qty 30, 30d supply, fill #0

## 2023-10-02 MED ORDER — CLONAZEPAM 0.5 MG PO TABS
0.5000 mg | ORAL_TABLET | Freq: Every day | ORAL | 0 refills | Status: DC
  Filled 2023-10-02 – 2023-10-03 (×2): qty 30, 30d supply, fill #0

## 2023-10-03 ENCOUNTER — Other Ambulatory Visit (HOSPITAL_BASED_OUTPATIENT_CLINIC_OR_DEPARTMENT_OTHER): Payer: Self-pay

## 2023-10-03 ENCOUNTER — Other Ambulatory Visit: Payer: Self-pay

## 2023-10-31 ENCOUNTER — Other Ambulatory Visit (HOSPITAL_BASED_OUTPATIENT_CLINIC_OR_DEPARTMENT_OTHER): Payer: Self-pay

## 2023-10-31 ENCOUNTER — Other Ambulatory Visit: Payer: Self-pay

## 2023-10-31 MED ORDER — CLONAZEPAM 0.5 MG PO TABS
0.5000 mg | ORAL_TABLET | Freq: Every day | ORAL | 0 refills | Status: DC
  Filled 2023-10-31: qty 30, 30d supply, fill #0

## 2023-11-01 ENCOUNTER — Other Ambulatory Visit (HOSPITAL_BASED_OUTPATIENT_CLINIC_OR_DEPARTMENT_OTHER): Payer: Self-pay

## 2023-11-01 MED ORDER — FLUOXETINE HCL 40 MG PO CAPS
40.0000 mg | ORAL_CAPSULE | Freq: Every day | ORAL | 3 refills | Status: AC
  Filled 2023-11-01: qty 90, 90d supply, fill #0
  Filled 2024-01-28: qty 90, 90d supply, fill #1

## 2023-11-02 ENCOUNTER — Other Ambulatory Visit (HOSPITAL_BASED_OUTPATIENT_CLINIC_OR_DEPARTMENT_OTHER): Payer: Self-pay

## 2023-11-02 MED ORDER — COLCHICINE 0.6 MG PO TABS
0.6000 mg | ORAL_TABLET | Freq: Every day | ORAL | 0 refills | Status: DC | PRN
  Filled 2023-11-02: qty 30, 30d supply, fill #0

## 2023-11-27 ENCOUNTER — Other Ambulatory Visit (HOSPITAL_BASED_OUTPATIENT_CLINIC_OR_DEPARTMENT_OTHER): Payer: Self-pay

## 2023-11-30 ENCOUNTER — Other Ambulatory Visit (HOSPITAL_BASED_OUTPATIENT_CLINIC_OR_DEPARTMENT_OTHER): Payer: Self-pay

## 2023-11-30 ENCOUNTER — Other Ambulatory Visit: Payer: Self-pay

## 2023-11-30 MED ORDER — COLCHICINE 0.6 MG PO TABS
0.6000 mg | ORAL_TABLET | Freq: Every day | ORAL | 0 refills | Status: DC | PRN
  Filled 2023-11-30: qty 30, 30d supply, fill #0

## 2023-12-01 ENCOUNTER — Other Ambulatory Visit (HOSPITAL_BASED_OUTPATIENT_CLINIC_OR_DEPARTMENT_OTHER): Payer: Self-pay

## 2023-12-03 ENCOUNTER — Other Ambulatory Visit (HOSPITAL_BASED_OUTPATIENT_CLINIC_OR_DEPARTMENT_OTHER): Payer: Self-pay

## 2023-12-03 MED ORDER — CLONAZEPAM 0.5 MG PO TABS
0.5000 mg | ORAL_TABLET | Freq: Every day | ORAL | 0 refills | Status: DC
  Filled 2023-12-03: qty 30, 30d supply, fill #0

## 2023-12-04 ENCOUNTER — Other Ambulatory Visit: Payer: Self-pay

## 2023-12-18 ENCOUNTER — Other Ambulatory Visit (HOSPITAL_BASED_OUTPATIENT_CLINIC_OR_DEPARTMENT_OTHER): Payer: Self-pay

## 2023-12-31 ENCOUNTER — Other Ambulatory Visit (HOSPITAL_BASED_OUTPATIENT_CLINIC_OR_DEPARTMENT_OTHER): Payer: Self-pay

## 2024-01-01 ENCOUNTER — Other Ambulatory Visit (HOSPITAL_BASED_OUTPATIENT_CLINIC_OR_DEPARTMENT_OTHER): Payer: Self-pay

## 2024-01-02 ENCOUNTER — Other Ambulatory Visit (HOSPITAL_BASED_OUTPATIENT_CLINIC_OR_DEPARTMENT_OTHER): Payer: Self-pay

## 2024-01-02 MED ORDER — COLCHICINE 0.6 MG PO TABS
0.6000 mg | ORAL_TABLET | Freq: Every day | ORAL | 0 refills | Status: DC | PRN
  Filled 2024-01-02: qty 30, 30d supply, fill #0

## 2024-01-04 ENCOUNTER — Other Ambulatory Visit (HOSPITAL_COMMUNITY): Payer: Self-pay

## 2024-01-07 ENCOUNTER — Other Ambulatory Visit (HOSPITAL_BASED_OUTPATIENT_CLINIC_OR_DEPARTMENT_OTHER): Payer: Self-pay

## 2024-01-08 ENCOUNTER — Other Ambulatory Visit (HOSPITAL_BASED_OUTPATIENT_CLINIC_OR_DEPARTMENT_OTHER): Payer: Self-pay

## 2024-01-08 MED ORDER — CLONAZEPAM 0.5 MG PO TABS
0.5000 mg | ORAL_TABLET | Freq: Every day | ORAL | 0 refills | Status: DC
  Filled 2024-01-08: qty 30, 30d supply, fill #0

## 2024-01-28 ENCOUNTER — Other Ambulatory Visit (HOSPITAL_BASED_OUTPATIENT_CLINIC_OR_DEPARTMENT_OTHER): Payer: Self-pay

## 2024-01-29 ENCOUNTER — Other Ambulatory Visit (HOSPITAL_BASED_OUTPATIENT_CLINIC_OR_DEPARTMENT_OTHER): Payer: Self-pay

## 2024-01-29 ENCOUNTER — Other Ambulatory Visit: Payer: Self-pay

## 2024-01-29 MED ORDER — COLCHICINE 0.6 MG PO TABS
0.6000 mg | ORAL_TABLET | Freq: Every day | ORAL | 0 refills | Status: DC | PRN
  Filled 2024-01-29: qty 30, 30d supply, fill #0

## 2024-01-30 ENCOUNTER — Other Ambulatory Visit (HOSPITAL_BASED_OUTPATIENT_CLINIC_OR_DEPARTMENT_OTHER): Payer: Self-pay

## 2024-02-06 ENCOUNTER — Other Ambulatory Visit (HOSPITAL_BASED_OUTPATIENT_CLINIC_OR_DEPARTMENT_OTHER): Payer: Self-pay

## 2024-02-08 ENCOUNTER — Other Ambulatory Visit (HOSPITAL_BASED_OUTPATIENT_CLINIC_OR_DEPARTMENT_OTHER): Payer: Self-pay

## 2024-02-08 MED ORDER — CLONAZEPAM 0.5 MG PO TABS
0.5000 mg | ORAL_TABLET | Freq: Every day | ORAL | 0 refills | Status: DC
  Filled 2024-02-09: qty 30, 30d supply, fill #0

## 2024-02-09 ENCOUNTER — Other Ambulatory Visit (HOSPITAL_BASED_OUTPATIENT_CLINIC_OR_DEPARTMENT_OTHER): Payer: Self-pay

## 2024-02-12 ENCOUNTER — Other Ambulatory Visit (HOSPITAL_BASED_OUTPATIENT_CLINIC_OR_DEPARTMENT_OTHER): Payer: Self-pay

## 2024-03-04 ENCOUNTER — Other Ambulatory Visit (HOSPITAL_BASED_OUTPATIENT_CLINIC_OR_DEPARTMENT_OTHER): Payer: Self-pay

## 2024-03-04 MED ORDER — COLCHICINE 0.6 MG PO TABS
0.6000 mg | ORAL_TABLET | Freq: Every day | ORAL | 0 refills | Status: AC | PRN
  Filled 2024-03-04: qty 30, 30d supply, fill #0

## 2024-03-05 ENCOUNTER — Other Ambulatory Visit (HOSPITAL_BASED_OUTPATIENT_CLINIC_OR_DEPARTMENT_OTHER): Payer: Self-pay

## 2024-03-10 ENCOUNTER — Other Ambulatory Visit (HOSPITAL_BASED_OUTPATIENT_CLINIC_OR_DEPARTMENT_OTHER): Payer: Self-pay

## 2024-03-11 ENCOUNTER — Other Ambulatory Visit (HOSPITAL_BASED_OUTPATIENT_CLINIC_OR_DEPARTMENT_OTHER): Payer: Self-pay

## 2024-03-11 MED ORDER — CLONAZEPAM 0.5 MG PO TABS
0.5000 mg | ORAL_TABLET | Freq: Every evening | ORAL | 0 refills | Status: AC
  Filled 2024-03-11: qty 30, 30d supply, fill #0

## 2024-03-11 MED ORDER — ROSUVASTATIN CALCIUM 10 MG PO TABS
10.0000 mg | ORAL_TABLET | Freq: Every day | ORAL | 0 refills | Status: AC
  Filled 2024-03-11: qty 90, 90d supply, fill #0

## 2024-03-14 ENCOUNTER — Other Ambulatory Visit (HOSPITAL_COMMUNITY): Payer: Self-pay
